# Patient Record
Sex: Female | Born: 1940 | Race: White | Hispanic: No | Smoking: Never smoker
Health system: Southern US, Community
[De-identification: ages and names within clinical notes are randomized; demographics above are authoritative.]

## PROBLEM LIST (undated history)

## (undated) DIAGNOSIS — I10 Essential (primary) hypertension: Secondary | ICD-10-CM

## (undated) DIAGNOSIS — E785 Hyperlipidemia, unspecified: Secondary | ICD-10-CM

## (undated) HISTORY — PX: APPENDECTOMY: SHX54

## (undated) HISTORY — PX: KNEE SURGERY: SHX244

## (undated) HISTORY — PX: CHOLECYSTECTOMY: SHX55

## (undated) HISTORY — PX: CARDIAC CATHETERIZATION: SHX172

## (undated) HISTORY — PX: OTHER SURGICAL HISTORY: SHX169

## (undated) HISTORY — PX: HEMORRHOID SURGERY: SHX153

## (undated) HISTORY — PX: ABDOMINAL HYSTERECTOMY: SHX81

---

## 2000-07-06 ENCOUNTER — Encounter: Payer: Self-pay | Admitting: Family Medicine

## 2000-07-06 ENCOUNTER — Ambulatory Visit (HOSPITAL_COMMUNITY): Admission: RE | Admit: 2000-07-06 | Discharge: 2000-07-06 | Payer: Self-pay | Admitting: Family Medicine

## 2000-09-09 ENCOUNTER — Emergency Department (HOSPITAL_COMMUNITY): Admission: EM | Admit: 2000-09-09 | Discharge: 2000-09-09 | Payer: Self-pay | Admitting: *Deleted

## 2000-09-10 ENCOUNTER — Encounter: Payer: Self-pay | Admitting: Internal Medicine

## 2000-09-10 ENCOUNTER — Ambulatory Visit (HOSPITAL_COMMUNITY): Admission: RE | Admit: 2000-09-10 | Discharge: 2000-09-10 | Payer: Self-pay | Admitting: Internal Medicine

## 2000-09-25 ENCOUNTER — Ambulatory Visit (HOSPITAL_COMMUNITY): Admission: RE | Admit: 2000-09-25 | Discharge: 2000-09-25 | Payer: Self-pay | Admitting: Orthopedic Surgery

## 2001-02-03 ENCOUNTER — Encounter: Payer: Self-pay | Admitting: Family Medicine

## 2001-02-03 ENCOUNTER — Ambulatory Visit (HOSPITAL_COMMUNITY): Admission: RE | Admit: 2001-02-03 | Discharge: 2001-02-03 | Payer: Self-pay | Admitting: Family Medicine

## 2001-03-02 ENCOUNTER — Ambulatory Visit (HOSPITAL_COMMUNITY): Admission: RE | Admit: 2001-03-02 | Discharge: 2001-03-02 | Payer: Self-pay | Admitting: Family Medicine

## 2001-03-03 ENCOUNTER — Encounter: Payer: Self-pay | Admitting: Family Medicine

## 2001-12-15 ENCOUNTER — Ambulatory Visit (HOSPITAL_COMMUNITY): Admission: RE | Admit: 2001-12-15 | Discharge: 2001-12-16 | Payer: Self-pay | Admitting: Cardiology

## 2002-02-10 ENCOUNTER — Ambulatory Visit (HOSPITAL_COMMUNITY): Admission: RE | Admit: 2002-02-10 | Discharge: 2002-02-10 | Payer: Self-pay | Admitting: Family Medicine

## 2002-02-10 ENCOUNTER — Encounter: Payer: Self-pay | Admitting: Family Medicine

## 2003-02-22 ENCOUNTER — Ambulatory Visit (HOSPITAL_COMMUNITY): Admission: RE | Admit: 2003-02-22 | Discharge: 2003-02-22 | Payer: Self-pay | Admitting: Family Medicine

## 2003-03-02 ENCOUNTER — Ambulatory Visit (HOSPITAL_COMMUNITY): Admission: RE | Admit: 2003-03-02 | Discharge: 2003-03-02 | Payer: Self-pay | Admitting: Family Medicine

## 2003-03-07 ENCOUNTER — Ambulatory Visit (HOSPITAL_COMMUNITY): Admission: RE | Admit: 2003-03-07 | Discharge: 2003-03-07 | Payer: Self-pay | Admitting: Family Medicine

## 2003-03-13 ENCOUNTER — Encounter: Payer: Self-pay | Admitting: Orthopedic Surgery

## 2004-02-13 ENCOUNTER — Ambulatory Visit (HOSPITAL_COMMUNITY): Admission: RE | Admit: 2004-02-13 | Discharge: 2004-02-13 | Payer: Self-pay | Admitting: Internal Medicine

## 2004-08-22 ENCOUNTER — Other Ambulatory Visit: Admission: RE | Admit: 2004-08-22 | Discharge: 2004-08-22 | Payer: Self-pay | Admitting: Obstetrics and Gynecology

## 2005-02-14 ENCOUNTER — Ambulatory Visit (HOSPITAL_COMMUNITY): Admission: RE | Admit: 2005-02-14 | Discharge: 2005-02-14 | Payer: Self-pay | Admitting: Internal Medicine

## 2005-04-03 ENCOUNTER — Other Ambulatory Visit: Admission: RE | Admit: 2005-04-03 | Discharge: 2005-04-03 | Payer: Self-pay | Admitting: Dermatology

## 2005-05-28 ENCOUNTER — Ambulatory Visit (HOSPITAL_COMMUNITY): Admission: RE | Admit: 2005-05-28 | Discharge: 2005-05-28 | Payer: Self-pay | Admitting: Family Medicine

## 2005-10-13 ENCOUNTER — Inpatient Hospital Stay (HOSPITAL_COMMUNITY): Admission: RE | Admit: 2005-10-13 | Discharge: 2005-10-22 | Payer: Self-pay | Admitting: Internal Medicine

## 2005-10-17 ENCOUNTER — Ambulatory Visit (HOSPITAL_COMMUNITY): Admission: RE | Admit: 2005-10-17 | Discharge: 2005-10-17 | Payer: Self-pay | Admitting: Internal Medicine

## 2005-10-18 ENCOUNTER — Ambulatory Visit: Payer: Self-pay | Admitting: Internal Medicine

## 2005-10-31 ENCOUNTER — Ambulatory Visit (HOSPITAL_COMMUNITY): Admission: RE | Admit: 2005-10-31 | Discharge: 2005-10-31 | Payer: Self-pay | Admitting: Internal Medicine

## 2005-11-05 ENCOUNTER — Ambulatory Visit: Payer: Self-pay | Admitting: Internal Medicine

## 2006-02-23 ENCOUNTER — Ambulatory Visit (HOSPITAL_COMMUNITY): Admission: RE | Admit: 2006-02-23 | Discharge: 2006-02-23 | Payer: Self-pay | Admitting: Internal Medicine

## 2006-08-20 ENCOUNTER — Ambulatory Visit (HOSPITAL_COMMUNITY): Admission: RE | Admit: 2006-08-20 | Discharge: 2006-08-20 | Payer: Self-pay | Admitting: Pulmonary Disease

## 2007-02-25 ENCOUNTER — Ambulatory Visit (HOSPITAL_COMMUNITY): Admission: RE | Admit: 2007-02-25 | Discharge: 2007-02-25 | Payer: Self-pay | Admitting: Internal Medicine

## 2008-02-20 ENCOUNTER — Emergency Department (HOSPITAL_COMMUNITY): Admission: EM | Admit: 2008-02-20 | Discharge: 2008-02-20 | Payer: Self-pay | Admitting: Emergency Medicine

## 2008-02-29 ENCOUNTER — Ambulatory Visit (HOSPITAL_COMMUNITY): Admission: RE | Admit: 2008-02-29 | Discharge: 2008-02-29 | Payer: Self-pay | Admitting: Internal Medicine

## 2008-10-12 ENCOUNTER — Emergency Department (HOSPITAL_COMMUNITY): Admission: EM | Admit: 2008-10-12 | Discharge: 2008-10-12 | Payer: Self-pay | Admitting: Emergency Medicine

## 2008-12-08 ENCOUNTER — Ambulatory Visit (HOSPITAL_COMMUNITY): Admission: RE | Admit: 2008-12-08 | Discharge: 2008-12-08 | Payer: Self-pay | Admitting: Family Medicine

## 2008-12-08 ENCOUNTER — Encounter: Payer: Self-pay | Admitting: Orthopedic Surgery

## 2008-12-14 ENCOUNTER — Ambulatory Visit: Payer: Self-pay | Admitting: Orthopedic Surgery

## 2008-12-14 DIAGNOSIS — M25569 Pain in unspecified knee: Secondary | ICD-10-CM | POA: Insufficient documentation

## 2008-12-14 DIAGNOSIS — M1710 Unilateral primary osteoarthritis, unspecified knee: Secondary | ICD-10-CM

## 2008-12-14 DIAGNOSIS — M23302 Other meniscus derangements, unspecified lateral meniscus, unspecified knee: Secondary | ICD-10-CM | POA: Insufficient documentation

## 2008-12-15 ENCOUNTER — Telehealth: Payer: Self-pay | Admitting: Orthopedic Surgery

## 2008-12-15 ENCOUNTER — Ambulatory Visit (HOSPITAL_COMMUNITY): Admission: RE | Admit: 2008-12-15 | Discharge: 2008-12-15 | Payer: Self-pay | Admitting: Orthopedic Surgery

## 2008-12-21 ENCOUNTER — Ambulatory Visit: Payer: Self-pay | Admitting: Orthopedic Surgery

## 2009-01-08 ENCOUNTER — Ambulatory Visit: Payer: Self-pay | Admitting: Orthopedic Surgery

## 2009-03-05 ENCOUNTER — Ambulatory Visit (HOSPITAL_COMMUNITY): Admission: RE | Admit: 2009-03-05 | Discharge: 2009-03-05 | Payer: Self-pay | Admitting: Family Medicine

## 2009-06-14 ENCOUNTER — Ambulatory Visit (HOSPITAL_COMMUNITY): Admission: RE | Admit: 2009-06-14 | Discharge: 2009-06-14 | Payer: Self-pay | Admitting: Family Medicine

## 2010-03-07 ENCOUNTER — Ambulatory Visit (HOSPITAL_COMMUNITY)
Admission: RE | Admit: 2010-03-07 | Discharge: 2010-03-07 | Payer: Self-pay | Source: Home / Self Care | Attending: Internal Medicine | Admitting: Internal Medicine

## 2010-04-14 ENCOUNTER — Encounter: Payer: Self-pay | Admitting: Internal Medicine

## 2010-06-04 ENCOUNTER — Other Ambulatory Visit (HOSPITAL_COMMUNITY): Payer: Self-pay | Admitting: Family Medicine

## 2010-06-04 ENCOUNTER — Ambulatory Visit (HOSPITAL_COMMUNITY)
Admission: RE | Admit: 2010-06-04 | Discharge: 2010-06-04 | Disposition: A | Discharge status: Home / Self Care | Payer: Medicare Other | Source: Ambulatory Visit | Attending: Family Medicine | Admitting: Family Medicine

## 2010-06-04 DIAGNOSIS — R42 Dizziness and giddiness: Secondary | ICD-10-CM | POA: Insufficient documentation

## 2010-06-04 DIAGNOSIS — R51 Headache: Secondary | ICD-10-CM | POA: Insufficient documentation

## 2010-06-04 DIAGNOSIS — R519 Headache, unspecified: Secondary | ICD-10-CM

## 2010-06-30 LAB — D-DIMER, QUANTITATIVE: D-Dimer, Quant: 0.46 ug/mL-FEU (ref 0.00–0.48)

## 2010-06-30 LAB — POCT CARDIAC MARKERS
CKMB, poc: <1 ng/mL — ABNORMAL LOW (ref 1.0–8.0)
Myoglobin, poc: 55.6 ng/mL (ref 12–200)

## 2010-06-30 LAB — DIFFERENTIAL
Eosinophils Absolute: 0.1 10*3/uL (ref 0.0–0.7)
Eosinophils Relative: 2 % (ref 0–5)
Lymphs Abs: 1.8 10*3/uL (ref 0.7–4.0)
Monocytes Absolute: 0.3 10*3/uL (ref 0.1–1.0)
Monocytes Relative: 5 % (ref 3–12)

## 2010-06-30 LAB — BASIC METABOLIC PANEL
Chloride: 108 mEq/L (ref 96–112)
GFR calc Af Amer: >60 mL/min (ref >60)
Potassium: 3.3 mEq/L — ABNORMAL LOW (ref 3.5–5.1)

## 2010-06-30 LAB — CBC
HCT: 39.9 % (ref 36.0–46.0)
MCV: 85.5 fL (ref 78.0–100.0)
RBC: 4.66 MIL/uL (ref 3.87–5.11)
WBC: 6.5 10*3/uL (ref 4.0–10.5)

## 2010-06-30 LAB — LIPASE, BLOOD: Lipase: 32 U/L (ref 11–59)

## 2010-08-09 NOTE — Discharge Summary (Signed)
NAME:  Tracy Townsend, FEHRMAN NO.:  [id number]   MEDICAL RECORD NO.:  [id number]          PATIENT TYPE:  INP   LOCATION:  A302                          FACILITY:  [facility]   PHYSICIAN:  Madelin Rear. Sherwood Gambler, MD  DATE OF BIRTH:  [birth year]   DATE OF ADMISSION:  10/13/2005  DATE OF DISCHARGE:  08/01/2007LH                                 DISCHARGE SUMMARY   DISCHARGE MEDICATIONS:  1. Fosamax D every week.  2. Premarin 0.325 p.o. daily.  3. Cozaar 100 mg p.o. daily.  4. Xanax p.r.n. 0.5 q.i.d. and q.h.s.  5. Detrol LA one q.i.d. p.r.n.  6. Spiriva one inhalation daily.  7. Cipro 500 mg p.o. b.i.d.  8. Aciphex 20 mg p.o. daily.   DISCHARGE DIAGNOSES:  1. Hypertension.  2. Allergic rhinitis.  3. Asthma, acute exacerbation.  4. Coronary artery disease.  5. Anxiety.  6. Uterine fibroids.  7. Vasomotor postmenopausal symptomatology.  8. Osteoarthritis.  9. Pyelonephritis.   SUMMARY:  Patient was admitted with satiety, nausea, weight loss, fever,  chills, back pain, and clinically noted to have CVA tenderness on  examination.  Laboratories confirmed including a CT a severe pyelonephritis  without abscess, however, marked nephric swelling and edema per radiology  report.  She was admitted with high-dose antibiotic therapy and analgesia.  Her blood culture confirmed E. coli sepsis.  She was continued on quinolone  antibiotics with improvement on a daily basis and discharged hemodynamically  clinically stable.  She was also seen in consultation by GI for her anorexia  and weight loss.      Madelin Rear. Sherwood Gambler, MD  Electronically Signed     LJF/MEDQ  D:  10/30/2005  T:  10/30/2005  Job:  [id number]

## 2010-08-09 NOTE — Cardiovascular Report (Signed)
NAME:  Tracy Townsend, Tracy Townsend                        ACCOUNT NO.:  [id number]   MEDICAL RECORD NO.:  [id number]                   PATIENT TYPE:  OIB   LOCATION:  2861                                 FACILITY:  [facility]   PHYSICIAN:  Cristy Hilts. Jacinto Halim, M.D.                  DATE OF BIRTH:  May 19, 1940   DATE OF PROCEDURE:  12/15/2001  DATE OF DISCHARGE:                              CARDIAC CATHETERIZATION   PROCEDURES PERFORMED:  1. Left heart catheterization including left ventriculography.  2. Selective right and left coronary arteriography.  3. Right femoral angiography and closure of the right femoral artery access     with Perclose.   INDICATION:  The patient is a 70 year old white female with a history of  hypertension, who underwent Cardiolite stress test which revealed  anteroapical ischemia.  Given her anxiety, chest pain and abnormal  Cardiolite stress test, a cardiac catheterization is being performed to  evaluate her coronary anatomy.   HEMODYNAMIC. DATA:  The left ventricular pressures were 159/5 with an end-  diastolic pressure of 18 mmHg. The aortic pressure 144/24 with a mean of 103  mmHg.  There was no pressure gradient across the aortic valve.   ANGIOGRAPHIC DATA:   LEFT VENTRICULOGRAM:  The left ventricular systolic function was normal and  ejection fraction was estimated at 60%.  There was no significant mitral  regurgitation.   Right coronary artery:  The right coronary artery is a very large caliber  vessel and a dominant vessel. It gives origin to a very large PDA and PLV  branches.  It has minimal luminal irregularity.   Circumflex artery:  The circumflex artery is a moderate to moderate large  caliber vessel. It gives origin to a moderate sized OM-1. It has mild  disease.   Left anterior descending artery:  The left anterior descending artery is a  large caliber vessel. It ends at the apex. It gives origin to three moderate  sized anterolateral branches, that is  diagonal #1, diagonal #2 and diagonal  #3 branches.  It has mild luminal irregularity.   IMPRESSION:  1. Normal left ventricular ejection fraction 60%.  2. Mild insignificant coronary artery disease.   RECOMMENDATIONS:  Based on her coronary anatomy, continued aggressive risk  factor modification is indicated.  Evaluation for noncardiac cause of chest  pain is indicated.   TECHNIQUE OF PROCEDURE:  Under the usual sterile precautions, using a 6  French right femoral artery access a 6 [place]  multipurpose B2 catheter was  advanced into the ascending aorta over a 0.035 inch J wire. The catheter was  gently advanced in the left ventricle and left ventricular pressures were  monitored. Hand contrast injection of the left ventricle was performed both  in LAO and RAO projection. Then the catheter was flushed with saline, pulled  back into the ascending aorta, and pressure gradient across gradient across  the aortic valve  was monitored. The right coronary artery was selectively  engaged and angiography was performed.  In a similar fashion, the left main  coronary artery was selectively engaged and angiography was performed. Then  the catheter was pulled out of the body.   Right femoral angiography was performed through the arterial access sheath  and then the access was closed with Perclose. Excellent hemostasis obtained.  The patient was transferred to recovery in a stable condition. The patient  tolerated the procedure well.                                                 Cristy Hilts. Jacinto Halim, M.D.    Pilar Plate  D:  12/15/2001  T:  12/18/2001  Job:  [id number]   cc:   Madelin Rear. Sherwood Gambler, M.D.  [address]  [place]  [place] [address]  Fax: [phone]   Sherral Hammers, M.D.
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
## 2010-08-09 NOTE — H&P (Signed)
NAME:  Tracy Townsend, Tracy Townsend NO.:  [id number]   MEDICAL RECORD NO.:  [id number]          PATIENT TYPE:  INP   LOCATION:  A302                          FACILITY:  [facility]   PHYSICIAN:  Madelin Rear. Sherwood Gambler, MD  DATE OF BIRTH:  [birth year]   DATE OF ADMISSION:  DATE OF DISCHARGE:  LH                                HISTORY & PHYSICAL   CHIEF COMPLAINT:  Fatigue, anorexia, chills.   HISTORY OF PRESENT ILLNESS:  Patient presented to the office with recent  urinary tract infection in March and had recurrent symptoms of burning on  urination, urgency, frequency, some left-sided flank pain, and chills.  She  denied any true rigors.  No vomiting.  Her appetite has been diminished and,  in fact, she has lost 4 pounds since her last visit in one month.   PAST MEDICAL HISTORY:  1.  Hypertension.  2.  Allergic rhinitis.  3.  Asthma.  4.  Mild coronary artery disease.  5.  Anxiety.  6.  Uterine fibroids.  7.  Postmenopausal.  8.  Vasomotor symptomatology.  9.  Osteoarthritis.   SOCIAL HISTORY:  Nonsmoker, nondrinker.  No drug use.  Widowed with one  daughter who is healthy.  She is status post hysterectomy.   FAMILY HISTORY:  Positive for COPD and cancer in her mother and father as  well as coronary artery disease in her paternal and maternal grandparents.  She has one brother deceased, prostate cancer.  Another brother had a  cerebrovascular accident.   REVIEW OF SYSTEMS:  As above.  She also complained of an unrelenting  bitemporal headache and malaise.  Otherwise, in detail negative.   PHYSICAL EXAMINATION:  __________ unremarkable.  HEENT:  No JVD or adenopathy.  NECK:  Supple.  CHEST:  Clear.  CARDIAC:  Regular rate and rhythm.  ABDOMEN:  Soft.  She does have some left-sided CVA tenderness, but not  marked.  EXTREMITIES:  Without clubbing, cyanosis, edema.  NEUROLOGIC:  Nonfocal.   LABORATORIES:  A CT scan was obtained to rule out occult malignancy in  addition to  rule out cerebral hemorrhage for the cause of her headache.  Dr.  Jena Gauss of radiology called me and was quite struck with the severity and  degree of pyelonephritis findings as well as urethral inflammation.  There  was no abscess.  According to Dr. Jena Gauss there was no extravasation or  evidence of obstruction.   IMPRESSION:  1.  Severe pyelonephritis with severe constitutional symptoms.  Patient will      be admitted for parenteral antibiotics pending cultures of blood and      urine.  The remainder of her medical problems will be observed.  We      would expect an observation.  2.  Hypertension.  We will continue her angiotensin receptor blocker, i.e.,      Cozaar and hold if any blood pressure issues arrive.  I am mostly going      to cover her in addition to quinolone with gentamicin pending culture      results due to the severity of inflammation.  Madelin Rear. Sherwood Gambler, MD  Electronically Signed     LJF/MEDQ  D:  10/13/2005  T:  10/13/2005  Job:  [id number]

## 2010-08-09 NOTE — Procedures (Signed)
NAME:  Tracy Townsend, AUST NO.:  [id number]   MEDICAL RECORD NO.:  [id number]          PATIENT TYPE:  OUT   LOCATION:  RAD                           FACILITY:  [facility]   PHYSICIAN:  Edward L. Juanetta Gosling, M.D.DATE OF BIRTH:  1940/06/20   DATE OF PROCEDURE:  DATE OF DISCHARGE:  08/20/2006                            PULMONARY FUNCTION TEST   PULMONARY FUNCTION TEST   RESULTS:  1. Spirometry shows no ventilatory defect and evidence of mild airflow      obstruction.  2. Lung volumes are normal.  3. DLCO is normal.  4. Arterial blood gases were normal.      Edward L. Juanetta Gosling, M.D.  Electronically Signed     ELH/MEDQ  D:  08/24/2006  T:  08/25/2006  Job:  [id number]
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
## 2011-01-07 ENCOUNTER — Other Ambulatory Visit (HOSPITAL_COMMUNITY): Payer: Self-pay | Admitting: Internal Medicine

## 2011-01-07 DIAGNOSIS — Z139 Encounter for screening, unspecified: Secondary | ICD-10-CM

## 2011-03-11 ENCOUNTER — Ambulatory Visit (HOSPITAL_COMMUNITY)
Admission: RE | Admit: 2011-03-11 | Discharge: 2011-03-11 | Disposition: A | Discharge status: Home / Self Care | Payer: Medicare Other | Source: Ambulatory Visit | Attending: Internal Medicine | Admitting: Internal Medicine

## 2011-03-11 DIAGNOSIS — Z139 Encounter for screening, unspecified: Secondary | ICD-10-CM

## 2011-03-11 DIAGNOSIS — Z1231 Encounter for screening mammogram for malignant neoplasm of breast: Secondary | ICD-10-CM | POA: Insufficient documentation

## 2011-06-27 ENCOUNTER — Emergency Department (HOSPITAL_COMMUNITY)
Admission: EM | Admit: 2011-06-27 | Discharge: 2011-06-27 | Disposition: A | Discharge status: Home / Self Care | Payer: Medicare Other | Attending: Emergency Medicine | Admitting: Emergency Medicine

## 2011-06-27 ENCOUNTER — Emergency Department (HOSPITAL_COMMUNITY): Payer: Medicare Other

## 2011-06-27 ENCOUNTER — Other Ambulatory Visit: Payer: Self-pay

## 2011-06-27 ENCOUNTER — Encounter (HOSPITAL_COMMUNITY): Payer: Self-pay

## 2011-06-27 DIAGNOSIS — H539 Unspecified visual disturbance: Secondary | ICD-10-CM | POA: Insufficient documentation

## 2011-06-27 DIAGNOSIS — M549 Dorsalgia, unspecified: Secondary | ICD-10-CM | POA: Insufficient documentation

## 2011-06-27 DIAGNOSIS — M2550 Pain in unspecified joint: Secondary | ICD-10-CM | POA: Insufficient documentation

## 2011-06-27 DIAGNOSIS — R0989 Other specified symptoms and signs involving the circulatory and respiratory systems: Secondary | ICD-10-CM | POA: Insufficient documentation

## 2011-06-27 DIAGNOSIS — R079 Chest pain, unspecified: Secondary | ICD-10-CM | POA: Insufficient documentation

## 2011-06-27 DIAGNOSIS — M542 Cervicalgia: Secondary | ICD-10-CM | POA: Insufficient documentation

## 2011-06-27 DIAGNOSIS — M79609 Pain in unspecified limb: Secondary | ICD-10-CM | POA: Insufficient documentation

## 2011-06-27 DIAGNOSIS — I10 Essential (primary) hypertension: Secondary | ICD-10-CM | POA: Insufficient documentation

## 2011-06-27 DIAGNOSIS — M25519 Pain in unspecified shoulder: Secondary | ICD-10-CM | POA: Insufficient documentation

## 2011-06-27 DIAGNOSIS — R0609 Other forms of dyspnea: Secondary | ICD-10-CM | POA: Insufficient documentation

## 2011-06-27 LAB — HEPATIC FUNCTION PANEL
Albumin: 3.7 g/dL (ref 3.5–5.2)
Bilirubin, Direct: 0.1 mg/dL (ref 0.0–0.3)
Total Bilirubin: 0.5 mg/dL (ref 0.3–1.2)

## 2011-06-27 LAB — CBC
MCH: 28.4 pg (ref 26.0–34.0)
MCHC: 32.6 g/dL (ref 30.0–36.0)
Platelets: 288 10*3/uL (ref 150–400)
RDW: 13.3 % (ref 11.5–15.5)

## 2011-06-27 LAB — BASIC METABOLIC PANEL
BUN: 21 mg/dL (ref 6–23)
GFR calc Af Amer: 73 mL/min — ABNORMAL LOW (ref >90)
GFR calc non Af Amer: 63 mL/min — ABNORMAL LOW (ref >90)
Potassium: 4 mEq/L (ref 3.5–5.1)

## 2011-06-27 LAB — DIFFERENTIAL
Basophils Relative: 0 % (ref 0–1)
Eosinophils Absolute: 0.1 10*3/uL (ref 0.0–0.7)
Monocytes Relative: 8 % (ref 3–12)
Neutrophils Relative %: 59 % (ref 43–77)

## 2011-06-27 MED ORDER — IOHEXOL 350 MG/ML SOLN
100.0000 mL | Freq: Once | INTRAVENOUS | Status: AC | PRN
  Administered 2011-06-27: 100 mL via INTRAVENOUS

## 2011-06-27 NOTE — Discharge Instructions (Signed)
Continue taking twice your bp medicine until you see dr. Sherwood Gambler next week.

## 2011-06-27 NOTE — ED Provider Notes (Cosign Needed)
This chart was scribed for Tracy Lennert, MD by Williemae Natter. The patient was seen in room APA07/APA07 at 11:56 AM.  History     CSN: [id number]  Arrival date & time 06/27/11  1119   First MD Initiated Contact with Patient 06/27/11 1150      Chief Complaint  Patient presents with  . Chest Pain  . Shoulder Pain  . Arm Pain    (Consider location/radiation/quality/duration/timing/severity/associated sxs/prior treatment) Patient is a 71 y.o. female presenting with chest pain. The history is provided by the patient (pt has had chest pain going into back.  sent over by her md). No language interpreter was used.  Chest Pain The chest pain began 2 days ago. Chest pain occurs frequently. The chest pain is improving. The pain is associated with lifting. At its most intense, the pain is at 3/10. The pain is currently at 2/10. The severity of the pain is moderate. The quality of the pain is described as aching. The pain does not radiate. Chest pain is worsened by certain positions. Pertinent negatives for primary symptoms include no fatigue, no cough and no abdominal pain.  Pertinent negatives for past medical history include no seizures.    VESTER TITSWORTH is a 71 y.o. female who presents to the Emergency Department complaining of intermittent moderate acute onset chest pain. Pt started seeing spots 3 days ago. Pt has discomfort in neck, back, chest, shoulder, and bottom of arms for 3 days. Pt has associated nausea. No excessive sweating. Pt had a cardiac cath done 7 years ago. Pt has pain in both shoulders since February.  Cardiologist-Dr. Allyson Sabal History reviewed. No pertinent past medical history.  Past Surgical History  Procedure Date  . Abdominal hysterectomy   . Cholecystectomy   . Bladder surg   . Knee surgery   . Cardiac catheterization     No family history on file.  History  Substance Use Topics  . Smoking status: Never Smoker   . Smokeless tobacco: Not on file  . Alcohol  Use: No    OB History    Grav Para Term Preterm Abortions TAB SAB Ect Mult Living                  Review of Systems  Constitutional: Negative for fatigue.  HENT: Positive for neck pain. Negative for congestion, sinus pressure and ear discharge.   Eyes: Positive for visual disturbance. Negative for discharge.  Respiratory: Negative for cough.   Cardiovascular: Positive for chest pain.  Gastrointestinal: Negative for abdominal pain and diarrhea.  Genitourinary: Negative for frequency and hematuria.  Musculoskeletal: Positive for arthralgias. Negative for back pain.  Skin: Negative for rash.  Neurological: Negative for seizures and headaches.  Hematological: Negative.   Psychiatric/Behavioral: Negative for hallucinations.  All other systems reviewed and are negative.    Allergies  Morphine and related  Home Medications  No current outpatient prescriptions on file.  BP 151/88  Pulse 74  Temp(Src) 98.2 F (36.8 C) (Oral)  Resp 18  Ht 5\' 2"  (1.575 m)  Wt 135 lb (61.236 kg)  BMI 24.69 kg/m2  SpO2 100%  Physical Exam  Nursing note and vitals reviewed. Constitutional: She is oriented to person, place, and time. She appears well-developed and well-nourished. No distress.  HENT:  Head: Normocephalic and atraumatic.  Eyes: Conjunctivae and EOM are normal. No scleral icterus.  Neck: Normal range of motion. Neck supple. No thyromegaly present.  Cardiovascular: Normal rate and regular rhythm.  Exam reveals  no gallop and no friction rub.   No murmur heard. Pulmonary/Chest: No stridor. She is in respiratory distress. She has no wheezes. She has no rales. She exhibits no tenderness.       Tenderness in mid thoracic area  Abdominal: Soft. She exhibits no distension. There is no tenderness. There is no rebound.  Lymphadenopathy:    She has no cervical adenopathy.  Neurological: She is alert and oriented to person, place, and time. Coordination normal.  Skin: Skin is warm and  dry. No rash noted. No erythema.  Psychiatric: She has a normal mood and affect. Her behavior is normal.    ED Course  Procedures (including critical care time) DIAGNOSTIC STUDIES: Oxygen Saturation is 100% on room air, normal by my interpretation.    COORDINATION OF CARE: Medications - No data to display     Labs Reviewed  CBC  DIFFERENTIAL  BASIC METABOLIC PANEL  TROPONIN I   No results found.   No diagnosis found.   Date: 06/27/2011  Rate: 71  Rhythm: normal sinus rhythm  QRS Axis: left  Intervals: normal  ST/T Wave abnormalities: normal  Conduction Disutrbances:none  Poor r wave progression  Narrative Interpretation:   Old EKG Reviewed: none available    MDM  Chest and back pain worse with movement.  Ct neg  The chart was scribed for me under my direct supervision.  I personally performed the history, physical, and medical decision making and all procedures in the evaluation of this patient.Tracy Lennert, MD 06/28/11 [phone]

## 2011-06-27 NOTE — ED Notes (Signed)
Pt c/o pain in center of chest and aching in back across both shoulders x 3 days.  Says has been intermittent  And has had some nausea and SOB.  Reports bp has been for 3 days as well and started doubling her bp medication.

## 2011-06-27 NOTE — ED Notes (Signed)
Pt sent by Dr Sherwood Gambler for chest pain, shoulder pain, and arm pain x 3 days.

## 2011-06-28 ENCOUNTER — Emergency Department (HOSPITAL_COMMUNITY): Payer: Medicare Other

## 2011-06-28 ENCOUNTER — Other Ambulatory Visit: Payer: Self-pay

## 2011-06-28 ENCOUNTER — Encounter (HOSPITAL_COMMUNITY): Payer: Self-pay | Admitting: Emergency Medicine

## 2011-06-28 ENCOUNTER — Emergency Department (HOSPITAL_COMMUNITY)
Admission: EM | Admit: 2011-06-28 | Discharge: 2011-06-28 | Disposition: A | Discharge status: Home / Self Care | Payer: Medicare Other | Attending: Emergency Medicine | Admitting: Emergency Medicine

## 2011-06-28 DIAGNOSIS — R252 Cramp and spasm: Secondary | ICD-10-CM | POA: Insufficient documentation

## 2011-06-28 DIAGNOSIS — Z7982 Long term (current) use of aspirin: Secondary | ICD-10-CM | POA: Insufficient documentation

## 2011-06-28 DIAGNOSIS — R079 Chest pain, unspecified: Secondary | ICD-10-CM | POA: Insufficient documentation

## 2011-06-28 DIAGNOSIS — R42 Dizziness and giddiness: Secondary | ICD-10-CM | POA: Insufficient documentation

## 2011-06-28 DIAGNOSIS — R5381 Other malaise: Secondary | ICD-10-CM | POA: Insufficient documentation

## 2011-06-28 DIAGNOSIS — R202 Paresthesia of skin: Secondary | ICD-10-CM

## 2011-06-28 DIAGNOSIS — R209 Unspecified disturbances of skin sensation: Secondary | ICD-10-CM | POA: Insufficient documentation

## 2011-06-28 DIAGNOSIS — F29 Unspecified psychosis not due to a substance or known physiological condition: Secondary | ICD-10-CM | POA: Insufficient documentation

## 2011-06-28 DIAGNOSIS — I10 Essential (primary) hypertension: Secondary | ICD-10-CM | POA: Insufficient documentation

## 2011-06-28 DIAGNOSIS — E785 Hyperlipidemia, unspecified: Secondary | ICD-10-CM | POA: Insufficient documentation

## 2011-06-28 DIAGNOSIS — Z79899 Other long term (current) drug therapy: Secondary | ICD-10-CM | POA: Insufficient documentation

## 2011-06-28 HISTORY — DX: Essential (primary) hypertension: I10

## 2011-06-28 HISTORY — DX: Hyperlipidemia, unspecified: E78.5

## 2011-06-28 LAB — CBC
HCT: 43.9 % (ref 36.0–46.0)
Hemoglobin: 14.1 g/dL (ref 12.0–15.0)
MCH: 27.9 pg (ref 26.0–34.0)
MCHC: 32.1 g/dL (ref 30.0–36.0)
MCV: 86.9 fL (ref 78.0–100.0)
Platelets: 289 K/uL (ref 150–400)
RBC: 5.05 MIL/uL (ref 3.87–5.11)
RDW: 13.3 % (ref 11.5–15.5)
WBC: 8.5 K/uL (ref 4.0–10.5)

## 2011-06-28 LAB — BASIC METABOLIC PANEL WITH GFR
BUN: 26 mg/dL — ABNORMAL HIGH (ref 6–23)
CO2: 26 meq/L (ref 19–32)
Calcium: 9.3 mg/dL (ref 8.4–10.5)
Chloride: 103 meq/L (ref 96–112)
Creatinine, Ser: 0.97 mg/dL (ref 0.50–1.10)
GFR calc Af Amer: 66 mL/min — ABNORMAL LOW
GFR calc non Af Amer: 57 mL/min — ABNORMAL LOW
Glucose, Bld: 112 mg/dL — ABNORMAL HIGH (ref 70–99)
Potassium: 4.1 meq/L (ref 3.5–5.1)
Sodium: 137 meq/L (ref 135–145)

## 2011-06-28 LAB — URINALYSIS, ROUTINE W REFLEX MICROSCOPIC
Bilirubin Urine: NEGATIVE
Hgb urine dipstick: NEGATIVE
Protein, ur: NEGATIVE mg/dL
Urobilinogen, UA: 0.2 mg/dL (ref 0.0–1.0)

## 2011-06-28 LAB — TROPONIN I: Troponin I: <0.3 ng/mL

## 2011-06-28 LAB — DIFFERENTIAL
Lymphocytes Relative: 29 % (ref 12–46)
Lymphs Abs: 2.5 10*3/uL (ref 0.7–4.0)
Neutro Abs: 5.3 10*3/uL (ref 1.7–7.7)
Neutrophils Relative %: 63 % (ref 43–77)

## 2011-06-28 LAB — CK: Total CK: 66 U/L (ref 7–177)

## 2011-06-28 MED ORDER — DIAZEPAM 2 MG PO TABS: 2.0000 mg | ORAL_TABLET | Freq: Once | ORAL | Status: DC

## 2011-06-28 NOTE — ED Provider Notes (Signed)
History     CSN: [id number]  Arrival date & time 06/28/11  1644   First MD Initiated Contact with Patient 06/28/11 1657      Chief Complaint  Patient presents with  . Chest Pain  . Hypertension  . Weakness     HPI Pt was seen at 1715.   Per pt, c/o gradual onset and persistence of constant generalized weakness for the past 3 to 4 days.  Has been associated with multiple other symptoms.  Pt states when moving from sit to standing positions for the past 4 days she c/o feeling lightheaded, "tingling and numb all over" her body (no specific area of tingling/numbness), "cold feeling" all over her anterior torso (denies specific CP or abd pain), seeing "spots before my eyes," and "my thoughts get all jumbled up and I feel confused" which all improve after she sits back down or lays down.  States these symptoms only occur when she is home alone by herself.  She went out this morning/afternoon with her daughter shopping and to lunch and did not experience any symptoms other than "just feeling weak."  Pt states she has been taking her BP at home "every 2 hours" for the last 4 days, and these symptoms generally occur when she stands up to go take her BP.  Pt states her BP "shoots up" throughout the day to "160-180's/100-110's."  States she has been taking "double" of her BP meds "for a while now" and forgot to tell the EDP yesterday when she was seen for these symptoms or her PMD yesterday and today when she called regarding these multiple symptoms.  Pt also c/o "muscles cramping" in her bilat LE's "for a while now;" denies any change in this muscle cramping over the past several weeks to months.  Denies focal motor weakness, no tingling/numbness in extremities, no fevers, no rash, no back pain, no abd pain, no CP/SOB, no cough.       Past Medical History  Diagnosis Date  . Hypertension   . Hyperlipemia     Past Surgical History  Procedure Date  . Abdominal hysterectomy   . Cholecystectomy   .  Bladder surg   . Knee surgery   . Cardiac catheterization     History  Substance Use Topics  . Smoking status: Never Smoker   . Smokeless tobacco: Not on file  . Alcohol Use: No    Review of Systems ROS: Statement: All systems negative except as marked or noted in the HPI; Constitutional: Negative for fever and chills. ; ; Eyes: Negative for eye pain, redness and discharge. ; ; ENMT: Negative for ear pain, hoarseness, nasal congestion, sinus pressure and sore throat. ; ; Cardiovascular: Negative for chest pain, palpitations, diaphoresis, dyspnea and peripheral edema. ; ; Respiratory: Negative for cough, wheezing and stridor. ; ; Gastrointestinal: Negative for nausea, vomiting, diarrhea, abdominal pain, blood in stool, hematemesis, jaundice and rectal bleeding. . ; ; Genitourinary: Negative for dysuria, flank pain and hematuria. ; ; Musculoskeletal: Negative for back pain and neck pain. Negative for swelling and trauma.; ; Skin: Negative for pruritus, rash, abrasions, blisters, bruising and skin lesion.; ; Neuro: +generalized weakness, paresthesias, confusion. Negative for headache, lightheadedness and neck stiffness. Negative for altered level of consciousness , extremity weakness, involuntary movement, seizure and syncope.      Allergies  Morphine and related and Latex  Home Medications   Current Outpatient Rx  Name Route Sig Dispense Refill  . ASPIRIN EC 81 MG PO TBEC  Oral Take 162 mg by mouth daily.    Marland Kitchen LOSARTAN POTASSIUM 50 MG PO TABS Oral Take 50 mg by mouth daily.    Marland Kitchen PRAVASTATIN SODIUM 20 MG PO TABS Oral Take 20 mg by mouth daily.      BP 192/101  Pulse 73  Temp(Src) 98.4 F (36.9 C) (Oral)  Resp 18  Ht 5\' 2"  (1.575 m)  Wt 132 lb (59.875 kg)  BMI 24.14 kg/m2  SpO2 100%  Physical Exam 1720: Physical examination:  Nursing notes reviewed; Vital signs and O2 SAT reviewed;  Constitutional: Well developed, Well nourished, Well hydrated, In no acute distress; Head:   Normocephalic, atraumatic; Eyes: EOMI, PERRL, No scleral icterus; ENMT: Mouth and pharynx normal, Mucous membranes moist; Neck: Supple, Full range of motion, No lymphadenopathy; Cardiovascular: Regular rate and rhythm, No murmur, rub, or gallop; Respiratory: Breath sounds clear & equal bilaterally, No rales, rhonchi, wheezes, or rub, Normal respiratory effort/excursion; Chest: Nontender, Movement normal; Abdomen: Soft, Nontender, Nondistended, Normal bowel sounds; Genitourinary: No CVA tenderness; Extremities: Pulses normal, No tenderness, No edema, No calf edema or asymmetry.; Neuro: AA&Ox3, Major CN grossly intact. Speech clear, no facial droop, normal coordination. No gross focal motor or sensory deficits in extremities.; Skin: Color normal, Warm, Dry, no rash; Psych:  Affect flat, poor eye contact, anxious and argumentative.    ED Course  Procedures  1725:  Pt is a rambling historian.  Issues pt keeps repeating to me today:  Symptoms occur when she is home alone only, they did not occur when she was out shopping/having lunch with her family today, occur before she takes her BP every 2 hours, and occur only during sit to stand position change only (after she has been sitting for a prolonged period of time).  Pt was eval yesterday in ED for chest and shoulders pain with negative work up and she denies having these symptoms today; pt apparently did not c/o today's symptoms during yesterday's ED visit.  Pt appears anxious during my exam, easily argumentative and agitated, laying with her eyes closed refusing to make eye contact with me while she tells me her HPI.  Pt will then intermittently grab her left leg and c/o "muscles are cramping where I broke my leg a long time ago, remember?" to her daughter.  Pedal pulses are palpable and equal bilat, skin is W/D/good color, muscle compartments are soft, no calf asymmetry or specific tenderness on exam.  No known hx of PAD or TAA/AAA (has had normal aorta per [place]  abd 2007 and CT scans 2007, 2008 and yesterday's ED visit). Will order a dose valium for muscle cramping here as work up progresses.  Very long d/w pt and family regarding taking her home BP's "every 2 hours," and ED treatment for HTN:  pt verb understanding, though still continues to argue with me about her "BP shooting up" and that she was "sent here for something to be done."  Explained work up I was ordering and that dropping BP precipitously can cause stroke and/or seizure; therefore BP needs to be slowly lowered over days to weeks.  Verb understanding.          MDM  MDM Reviewed: previous chart, nursing note and vitals Reviewed previous: ECG, CT scan and labs Interpretation: ECG, labs, x-ray and CT scan    Date: 06/28/2011  Rate: 74  Rhythm: normal sinus rhythm  QRS Axis: left  Intervals: normal  ST/T Wave abnormalities: normal  Conduction Disutrbances:none  Narrative Interpretation:  Old EKG Reviewed: unchanged; no significant changes from previous EKG dated 06/27/2011 and is improved from previous EKG dated 10/12/2008 which had NS STTW changes present.   Results for orders placed during the hospital encounter of 06/28/11  CBC      Component Value Range   WBC 8.5  4.0 - 10.5 (K/uL)   RBC 5.05  3.87 - 5.11 (MIL/uL)   Hemoglobin 14.1  12.0 - 15.0 (g/dL)   HCT 16.1  09.6 - 04.5 (%)   MCV 86.9  78.0 - 100.0 (fL)   MCH 27.9  26.0 - 34.0 (pg)   MCHC 32.1  30.0 - 36.0 (g/dL)   RDW 40.9  81.1 - 91.4 (%)   Platelets 289  150 - 400 (K/uL)  DIFFERENTIAL      Component Value Range   Neutrophils Relative 63  43 - 77 (%)   Neutro Abs 5.3  1.7 - 7.7 (K/uL)   Lymphocytes Relative 29  12 - 46 (%)   Lymphs Abs 2.5  0.7 - 4.0 (K/uL)   Monocytes Relative 7  3 - 12 (%)   Monocytes Absolute 0.6  0.1 - 1.0 (K/uL)   Eosinophils Relative 1  0 - 5 (%)   Eosinophils Absolute 0.1  0.0 - 0.7 (K/uL)   Basophils Relative 1  0 - 1 (%)   Basophils Absolute 0.0  0.0 - 0.1 (K/uL)  BASIC METABOLIC  PANEL      Component Value Range   Sodium 137  135 - 145 (mEq/L)   Potassium 4.1  3.5 - 5.1 (mEq/L)   Chloride 103  96 - 112 (mEq/L)   CO2 26  19 - 32 (mEq/L)   Glucose, Bld 112 (*) 70 - 99 (mg/dL)   BUN 26 (*) 6 - 23 (mg/dL)   Creatinine, Ser 7.82  0.50 - 1.10 (mg/dL)   Calcium 9.3  8.4 - 95.6 (mg/dL)   GFR calc non Af Amer 57 (*) >90 (mL/min)   GFR calc Af Amer 66 (*) >90 (mL/min)  TROPONIN I      Component Value Range   Troponin I <0.30  <0.30 (ng/mL)  CK      Component Value Range   Total CK 66  7 - 177 (U/L)  URINALYSIS, ROUTINE W REFLEX MICROSCOPIC      Component Value Range   Color, Urine YELLOW  YELLOW    APPearance CLEAR  CLEAR    Specific Gravity, Urine 1.010  1.005 - 1.030    pH 6.5  5.0 - 8.0    Glucose, UA NEGATIVE  NEGATIVE (mg/dL)   Hgb urine dipstick NEGATIVE  NEGATIVE    Bilirubin Urine NEGATIVE  NEGATIVE    Ketones, ur NEGATIVE  NEGATIVE (mg/dL)   Protein, ur NEGATIVE  NEGATIVE (mg/dL)   Urobilinogen, UA 0.2  0.0 - 1.0 (mg/dL)   Nitrite NEGATIVE  NEGATIVE    Leukocytes, UA NEGATIVE  NEGATIVE    Dg Chest 2 View 06/28/2011  *RADIOLOGY REPORT*  Clinical Data: COPD.  Weakness  CHEST - 2 VIEW  Comparison: 06/27/2011  Findings: COPD with hyperinflation.  Negative for pneumonia. Negative for heart failure.  Sub centimeter nodule lateral to the aortic arch is unchanged from prior studies and unchanged from the CT 06/14/2009.  IMPRESSION: COPD.  No acute cardiopulmonary disease.  Original Report Authenticated By: Camelia Phenes, M.D.   Ct Head Wo Contrast 06/28/2011  *RADIOLOGY REPORT*  Clinical Data: Weakness and hypertension  CT HEAD WITHOUT CONTRAST  Technique:  Contiguous  axial images were obtained from the base of the skull through the vertex without contrast.  Comparison: 06/04/2010  Findings: There is mild low density within the the subcortical and periventricular white matter consistent with chronic small vessel ischemic change.  The brain has an otherwise normal  appearance without evidence for hemorrhage, infarction, hydrocephalus, or mass lesion.  There is no extra axial fluid collection.  The skull and paranasal sinuses are normal.  IMPRESSION:  1.  No acute intracranial abnormalities. 2.  Mild white matter changes compatible with chronic small vessel ischemic change.  Original Report Authenticated By: Rosealee Albee, M.D.   Ct Angio Chest W/cm &/or Wo Cm 06/27/2011  *RADIOLOGY REPORT*  Clinical Data:  Shoulder and arm pain, evaluate for thoracic aortic dissection.  CT ANGIOGRAPHY CHEST WITH CONTRAST  Technique:  Multidetector CT imaging of the chest was performed using the standard protocol during bolus administration of intravenous contrast.  Multiplanar CT image reconstructions including MIPs were obtained to evaluate the vascular anatomy.  Contrast:  100 ml Omnipaque-300  Comparison:  Chest CT - 06/14/2009; 08/20/2006  Vascular Findings:  Normal caliber of the ascending thoracic aorta measuring approximately 3.3 x 3.3 cm in greatest transverse axial dimension as measured at the level of the main pulmonary artery (image 36, series five).  There is minimal atherosclerotic calcification at the origin of the right common carotid artery and undersurface of the aortic arch.  Normal configuration of the aortic arch.  The imaged portions of the cervical vasculature are patent.  There is scattered minimal calcified and noncalcified eccentric atherosclerotic plaque within the normal caliber descending thoracic aorta.  The descending thoracic aorta measures approximately 2.1 x 2.2 cm as measured at the level of the diaphragmatic hiatus (image 77, series five).  No intraluminal filling defects to suggest dissection.  No periaortic stranding.  There is adequate opacification of the pulmonary arteries with the main pulmonary artery measuring 524 HU.  No filling defect within the pulmonary arteries to suggest acute pulmonary embolism.  The caliber of the main pulmonary artery is  normal.  Heart size is borderline enlarged.  No pericardial effusion.  Review of the MIP images confirms the above findings.  Nonvascular findings:  Unchanged appearance of approximately 11 x 8 mm nodule within the left upper lobe (image 22, series 6), unchanged since the 2008 examination.  Unchanged punctate (2 mm) nodule within the left lower lobe (image 53) also unchanged since the 2008 examination. No new pulmonary nodules.  No focal airspace opacities.  No pleural effusion or pneumothorax.  The central airways are patent.  No mediastinal, hilar or axillary lymphadenopathy.  Early arterial phase evaluation of the upper abdomen demonstrates unchanged dilatation of the common bile duct measuring approximately 6 mm in diameter (image 91) likely sequela of postcholecystectomy state  No acute or aggressive osseous abnormalities.  IMPRESSION:  1.  No acute findings within the chest.  Specifically, no evidence of thoracic aortic dissection or acute pulmonary embolism. 2.  Benign pulmonary nodules within the left upper and lower lobes.  Original Report Authenticated By: Waynard Reeds, M.D.   Dg Chest Portable 1 View 06/27/2011  *RADIOLOGY REPORT*  Clinical Data: Chest pain.  PORTABLE CHEST - 1 VIEW  Comparison: 06/14/2009 CT and 10/12/2008 chest x-ray.  Findings: Stable appearance of nodule projecting adjacent to the aortic arch.  Please see prior CT report.  Central pulmonary vascular prominence stable.  No infiltrate, congestive heart failure or pneumothorax.  Calcified mildly tortuous aorta.  Heart  size top normal.  IMPRESSION: No significant change.  Please see above.  Original Report Authenticated By: Fuller Canada, M.D.     2030:  Pt refused valium.  She has ambulated with steady gait, easy resps. Pt is not orthostatic.  Pt has not been symptomatic while in the ED over several hours of observation.  VS remain stable with SBP's running 140-150's (lowest was 114).  Pt wants to go home now.  T/C to Dr.  Sherwood Gambler, case discussed, including:  HPI, pertinent PM/SHx, VS/PE, dx testing, ED course and treatment:  Agreeable with ED work up for the past 2 days, requests to have pt STOP taking her BP's every 2 hours (only take every 2 days), and call ofc Monday morning to be seen in follow up on Monday.  Dx testing, as well as d/w Dr. Sherwood Gambler, d/w pt and family.  Questions answered.  Verb understanding, agreeable to d/c home with outpt f/u.           Laray Anger, DO 06/30/11 1601

## 2011-06-28 NOTE — ED Notes (Signed)
Patient transported to X-ray 

## 2011-06-28 NOTE — ED Notes (Signed)
Pt called Dr Sherwood Gambler and was told to come to ER for HTN.  Pt arrives in triage clutching chest.  Pt states her chest hurts, has weakness, and tingling all over.

## 2011-06-28 NOTE — Discharge Instructions (Signed)
RESOURCE GUIDE  Dental Problems  Patients with Medicaid: [facility] [phone]                                           [phone] Phone:  [phone]                                                  Phone:  [phone]  If unable to pay or uninsured, contact:  [facility] or [facility]. to become qualified for the adult dental clinic.  Chronic Pain Problems Contact [facility]  [phone] Patients need to be referred by their primary care doctor.  Insufficient Money for Medicine Contact [employer]:  call "211" or [facility] 9044397654.  No Primary Care Doctor Call [facility]  [phone] Other agencies that provide inexpensive medical care    [facility]  [phone]    [facility] Internal Medicine  [phone]    [facility]  [phone]    [facility]  [phone]    [facility]  [phone]    [facility]  [phone]  Psychological Services [facility] Behavioral Health  [phone] [facility]  [phone] [facility] Mental Health   [phone] (emergency services [phone])  Substance Abuse Resources [facility] Services  [phone] [facility] [phone] [facility] [place] [phone] Floydene Flock [phone] Residential & Outpatient Substance Abuse Program  [phone]  Abuse/Neglect [facility] Child Abuse Hotline (838)843-0794 [facility] Child Abuse Hotline 902-155-7723 (After Hours)  Emergency Shelter [facility] [phone]  Maternity Homes Room at the [place] [facility] [phone] [facility] [phone]  MRSA Hotline #:   [phone]    [facility] Resources  [facility]     [employer]                          [facility] Dept. [address] [place]                       [address]      [address]  Blondell Reveal Phone:  [phone]                                   Phone:  [phone]                 Phone:  [phone]  [facility] Mental Health Phone:  [phone]  [facility] Child Abuse Hotline 930 237 [phone] (After Hours)    Take your usual prescriptions as previously directed.  Take your blood pressure only ONCE per day, either in the morning after you take your medicine(s) or in the evening before you go to bed.  Always sit quietly for at least 15 minutes before taking your blood pressure.  Keep a diary of your blood pressures to show your doctor at your follow up office visit.  Call your regular medical doctor on Monday morning to schedule a follow up appointment for Monday.  Return to the Emergency Department immediately sooner if worsening.

## 2011-06-28 NOTE — ED Notes (Signed)
No complaints of being dizzy during orthostatic vital signs. 

## 2011-06-30 ENCOUNTER — Other Ambulatory Visit (HOSPITAL_COMMUNITY): Payer: Self-pay | Admitting: Internal Medicine

## 2011-06-30 ENCOUNTER — Ambulatory Visit (HOSPITAL_COMMUNITY)
Admission: RE | Admit: 2011-06-30 | Discharge: 2011-06-30 | Disposition: A | Discharge status: Home / Self Care | Payer: Medicare Other | Source: Ambulatory Visit | Attending: Internal Medicine | Admitting: Internal Medicine

## 2011-06-30 DIAGNOSIS — M79609 Pain in unspecified limb: Secondary | ICD-10-CM | POA: Insufficient documentation

## 2011-06-30 DIAGNOSIS — I739 Peripheral vascular disease, unspecified: Secondary | ICD-10-CM

## 2011-06-30 DIAGNOSIS — I10 Essential (primary) hypertension: Secondary | ICD-10-CM

## 2011-06-30 DIAGNOSIS — M79606 Pain in leg, unspecified: Secondary | ICD-10-CM

## 2011-06-30 LAB — URINE CULTURE
Colony Count: NO GROWTH
Culture: NO GROWTH

## 2011-12-24 ENCOUNTER — Other Ambulatory Visit (HOSPITAL_COMMUNITY): Payer: Self-pay | Admitting: Internal Medicine

## 2011-12-24 DIAGNOSIS — Z139 Encounter for screening, unspecified: Secondary | ICD-10-CM

## 2012-03-11 ENCOUNTER — Ambulatory Visit (HOSPITAL_COMMUNITY): Payer: Medicare Other

## 2012-03-15 ENCOUNTER — Ambulatory Visit (HOSPITAL_COMMUNITY)
Admission: RE | Admit: 2012-03-15 | Discharge: 2012-03-15 | Disposition: A | Discharge status: Home / Self Care | Payer: Medicare Other | Source: Ambulatory Visit | Attending: Internal Medicine | Admitting: Internal Medicine

## 2012-03-15 DIAGNOSIS — Z1231 Encounter for screening mammogram for malignant neoplasm of breast: Secondary | ICD-10-CM | POA: Insufficient documentation

## 2012-03-15 DIAGNOSIS — Z139 Encounter for screening, unspecified: Secondary | ICD-10-CM

## 2012-06-17 ENCOUNTER — Telehealth: Payer: Self-pay | Admitting: Orthopedic Surgery

## 2012-06-17 NOTE — Telephone Encounter (Signed)
Patient called, states she ran into Dr. Romeo Apple at [facility], although has not been seen in our office since 2010, and had arthroscopic surgery by Dr. Romeo Apple at that time.  She is asking if it is okay to go into the pool at the [facility] with a Baker's cyst.  She states she does not need to come for an appointment.  Her ph# is [phone]

## 2012-06-17 NOTE — Telephone Encounter (Signed)
yes

## 2012-06-17 NOTE — Telephone Encounter (Signed)
It is fine for the patient to go to the [facility] to the pool. That is the best exercise for arthritis.

## 2012-06-21 NOTE — Telephone Encounter (Signed)
Informed patient of Dr. Harrison's reply 

## 2012-07-04 ENCOUNTER — Emergency Department (HOSPITAL_COMMUNITY)
Admission: EM | Admit: 2012-07-04 | Discharge: 2012-07-04 | Disposition: A | Discharge status: Home / Self Care | Payer: Medicare Other | Attending: Emergency Medicine | Admitting: Emergency Medicine

## 2012-07-04 ENCOUNTER — Encounter (HOSPITAL_COMMUNITY): Payer: Self-pay | Admitting: *Deleted

## 2012-07-04 DIAGNOSIS — N390 Urinary tract infection, site not specified: Secondary | ICD-10-CM | POA: Insufficient documentation

## 2012-07-04 DIAGNOSIS — Z79899 Other long term (current) drug therapy: Secondary | ICD-10-CM | POA: Insufficient documentation

## 2012-07-04 DIAGNOSIS — R197 Diarrhea, unspecified: Secondary | ICD-10-CM | POA: Insufficient documentation

## 2012-07-04 DIAGNOSIS — Z90710 Acquired absence of both cervix and uterus: Secondary | ICD-10-CM | POA: Insufficient documentation

## 2012-07-04 DIAGNOSIS — R109 Unspecified abdominal pain: Secondary | ICD-10-CM | POA: Insufficient documentation

## 2012-07-04 DIAGNOSIS — E785 Hyperlipidemia, unspecified: Secondary | ICD-10-CM | POA: Insufficient documentation

## 2012-07-04 DIAGNOSIS — I10 Essential (primary) hypertension: Secondary | ICD-10-CM | POA: Insufficient documentation

## 2012-07-04 DIAGNOSIS — Z7982 Long term (current) use of aspirin: Secondary | ICD-10-CM | POA: Insufficient documentation

## 2012-07-04 DIAGNOSIS — R112 Nausea with vomiting, unspecified: Secondary | ICD-10-CM | POA: Insufficient documentation

## 2012-07-04 LAB — COMPREHENSIVE METABOLIC PANEL
BUN: 24 mg/dL — ABNORMAL HIGH (ref 6–23)
Calcium: 9.3 mg/dL (ref 8.4–10.5)
GFR calc Af Amer: 76 mL/min — ABNORMAL LOW (ref >90)
Glucose, Bld: 158 mg/dL — ABNORMAL HIGH (ref 70–99)
Sodium: 138 mEq/L (ref 135–145)
Total Protein: 7.8 g/dL (ref 6.0–8.3)

## 2012-07-04 LAB — URINALYSIS, ROUTINE W REFLEX MICROSCOPIC
Glucose, UA: NEGATIVE mg/dL
Leukocytes, UA: NEGATIVE
Protein, ur: NEGATIVE mg/dL
pH: 5.5 (ref 5.0–8.0)

## 2012-07-04 LAB — CBC WITH DIFFERENTIAL/PLATELET
HCT: 43.8 % (ref 36.0–46.0)
Hemoglobin: 14.9 g/dL (ref 12.0–15.0)
Lymphocytes Relative: 3 % — ABNORMAL LOW (ref 12–46)
Monocytes Absolute: 0.5 10*3/uL (ref 0.1–1.0)
Monocytes Relative: 4 % (ref 3–12)
Neutro Abs: 11.7 10*3/uL — ABNORMAL HIGH (ref 1.7–7.7)
WBC: 12.6 10*3/uL — ABNORMAL HIGH (ref 4.0–10.5)

## 2012-07-04 LAB — URINE MICROSCOPIC-ADD ON

## 2012-07-04 MED ORDER — DEXTROSE 5 % IV SOLN
1.0000 g | Freq: Once | INTRAVENOUS | Status: AC
  Administered 2012-07-04: 1 g via INTRAVENOUS
  Filled 2012-07-04: qty 10

## 2012-07-04 MED ORDER — SODIUM CHLORIDE 0.9 % IV SOLN
Freq: Once | INTRAVENOUS | Status: AC
  Administered 2012-07-04: 11:00:00 via INTRAVENOUS

## 2012-07-04 MED ORDER — CEPHALEXIN 500 MG PO CAPS: 500.0000 mg | ORAL_CAPSULE | Freq: Three times a day (TID) | ORAL | Status: DC

## 2012-07-04 MED ORDER — ONDANSETRON HCL 4 MG/2ML IJ SOLN
4.0000 mg | Freq: Once | INTRAMUSCULAR | Status: AC
  Administered 2012-07-04: 4 mg via INTRAVENOUS
  Filled 2012-07-04: qty 2

## 2012-07-04 MED ORDER — PANTOPRAZOLE SODIUM 40 MG IV SOLR
40.0000 mg | Freq: Once | INTRAVENOUS | Status: AC
  Administered 2012-07-04: 40 mg via INTRAVENOUS
  Filled 2012-07-04: qty 40

## 2012-07-04 MED ORDER — ACETAMINOPHEN 325 MG PO TABS
650.0000 mg | ORAL_TABLET | Freq: Once | ORAL | Status: AC
  Administered 2012-07-04: 650 mg via ORAL
  Filled 2012-07-04: qty 2

## 2012-07-04 MED ORDER — SODIUM CHLORIDE 0.9 % IV BOLUS (SEPSIS)
500.0000 mL | Freq: Once | INTRAVENOUS | Status: AC
  Administered 2012-07-04: 14:00:00 via INTRAVENOUS

## 2012-07-04 MED ORDER — ONDANSETRON HCL 8 MG PO TABS: 8.0000 mg | ORAL_TABLET | Freq: Three times a day (TID) | ORAL | Status: DC | PRN

## 2012-07-04 MED ORDER — METOCLOPRAMIDE HCL 5 MG/ML IJ SOLN
10.0000 mg | Freq: Once | INTRAMUSCULAR | Status: AC
  Administered 2012-07-04: 10 mg via INTRAVENOUS
  Filled 2012-07-04: qty 2

## 2012-07-04 NOTE — ED Notes (Signed)
Pt states N/V/D began at 0230. Pain also to lower abdomen.

## 2012-07-04 NOTE — ED Provider Notes (Signed)
History  This chart was scribed for Joya Gaskins, MD, by Candelaria Stagers, ED Scribe. This patient was seen in room APA09/APA09 and the patient's care was started at 10:59 AM   CSN: [id number]  Arrival date & time 07/04/12  1034   First MD Initiated Contact with Patient 07/04/12 1044      Chief Complaint  Patient presents with  . Emesis  . Diarrhea  . Abdominal Pain     Patient is a 72 y.o. female presenting with vomiting and diarrhea. The history is provided by the patient. No language interpreter was used.  Emesis Severity:  Mild Timing:  Intermittent Quality:  Bilious material Chronicity:  New Relieved by:  Nothing Worsened by:  Nothing tried Ineffective treatments:  None tried Associated symptoms: abdominal pain and diarrhea   Associated symptoms: no chills   Diarrhea:    Quality:  Watery   Severity:  Mild   Duration:  9 hours   Timing:  Intermittent Risk factors: no sick contacts   Diarrhea Associated symptoms: abdominal pain and vomiting   Associated symptoms: no chills and no fever    Tracy Townsend is a 72 y.o. female who presents to the Emergency Department complaining of sudden onset of nausea, vomiting, diarrhea, and abdominal pain that started around 2:30AM this morning.  She denies blood in stool, fever, chills.  Pt is also experiencing SOB with the nausea.  Pt reports that yesterday she felt tired and weak.  She has tried nothing to relieve the sx.       Past Medical History  Diagnosis Date  . Hypertension   . Hyperlipemia     Past Surgical History  Procedure Laterality Date  . Abdominal hysterectomy    . Cholecystectomy    . Bladder surg    . Knee surgery    . Cardiac catheterization      No family history on file.  History  Substance Use Topics  . Smoking status: Never Smoker   . Smokeless tobacco: Not on file  . Alcohol Use: No    OB History   Grav Para Term Preterm Abortions TAB SAB Ect Mult Living                  Review  of Systems  Constitutional: Negative for fever and chills.  Gastrointestinal: Positive for vomiting, abdominal pain and diarrhea.  All other systems reviewed and are negative.    Allergies  Morphine and related and Latex  Home Medications   Current Outpatient Rx  Name  Route  Sig  Dispense  Refill  . amLODipine (NORVASC) 5 MG tablet   Oral   Take 5 mg by mouth daily.         Marland Kitchen aspirin EC 81 MG tablet   Oral   Take 81 mg by mouth daily.          . fish oil-omega-3 fatty acids 1000 MG capsule   Oral   Take 1 g by mouth 2 (two) times daily.         Marland Kitchen ibuprofen (ADVIL,MOTRIN) 200 MG tablet   Oral   Take 200 mg by mouth 2 (two) times daily as needed.         Marland Kitchen losartan (COZAAR) 50 MG tablet   Oral   Take 100 mg by mouth daily.          . pravastatin (PRAVACHOL) 20 MG tablet   Oral   Take 20 mg by mouth daily.         Marland Kitchen  vitamin B-12 (CYANOCOBALAMIN) 500 MCG tablet   Oral   Take 250 mcg by mouth daily.            BP 141/74  Pulse 80  Temp(Src) 98.1 F (36.7 C) (Oral)  Resp 16  Ht 5\' 2"  (1.575 m)  Wt 130 lb (58.968 kg)  BMI 23.77 kg/m2  SpO2 100%  Physical Exam CONSTITUTIONAL: Well developed/well nourished HEAD: Normocephalic/atraumatic EYES: EOMI/PERRL, no icterus  ENMT: Mucous membranes dry NECK: supple no meningeal signs SPINE:entire spine nontender CV: S1/S2 noted, no murmurs/rubs/gallops noted LUNGS: Lungs are clear to auscultation bilaterally, no apparent distress ABDOMEN: soft, nontender, no rebound or guarding GU:no cva tenderness NEURO: Pt is awake/alert, moves all extremitiesx4 EXTREMITIES: pulses normal, full ROM SKIN: warm, color normal PSYCH: no abnormalities of mood noted  ED Course  Procedures  DIAGNOSTIC STUDIES: Oxygen Saturation is 100% on room air, normal by my interpretation.    COORDINATION OF CARE:  11:01 AM discussed course of care with pt which includes zofran and blood work.  Pt understands and agrees.     Labs Reviewed  COMPREHENSIVE METABOLIC PANEL - Abnormal; Notable for the following:    Glucose, Bld 158 (*)    BUN 24 (*)    GFR calc non Af Amer 65 (*)    GFR calc Af Amer 76 (*)    All other components within normal limits  CBC WITH DIFFERENTIAL - Abnormal; Notable for the following:    WBC 12.6 (*)    RBC 5.19 (*)    Neutrophils Relative 93 (*)    Neutro Abs 11.7 (*)    Lymphocytes Relative 3 (*)    Lymphs Abs 0.4 (*)    All other components within normal limits  LIPASE, BLOOD  LACTIC ACID, PLASMA    12:02 PM Pt feeling improved She is in no distress She reports both vomiting and diarrhea, abrupt in onset, suspect viral GI illness Will give another dose of zofran and try PO tylenol 3:23 PM Pt improved, taking PO Her abdomen is soft to palpation uti noted, rocephin given Stable for d/c We discussed strict return precautions   MDM  Nursing notes including past medical history and social history reviewed and considered in documentation Labs/vital reviewed and considered     Date: 07/04/2012  Rate: 77  Rhythm: normal sinus rhythm  QRS Axis: left  Intervals: normal  ST/T Wave abnormalities: nonspecific ST changes  Conduction Disutrbances:none  Narrative Interpretation:   Old EKG Reviewed: unchanged from 06/28/2011    I personally performed the services described in this documentation, which was scribed in my presence. The recorded information has been reviewed and is accurate.          Joya Gaskins, MD 07/04/12 1524

## 2012-07-04 NOTE — ED Notes (Signed)
Pt presents to er with n/v/d that started during the night, is associated with lower abd cramping and sob, pt states that she felt "jittery" yesterday but denies any other symptoms until the n/v/d started around 4"30 this am,

## 2012-07-04 NOTE — ED Notes (Addendum)
Pt given ice chips, warm blankets, ,

## 2012-07-04 NOTE — ED Notes (Signed)
Pt has one episode of n/v Dr. Bebe Shaggy notified,

## 2012-07-06 LAB — URINE CULTURE

## 2012-07-07 ENCOUNTER — Telehealth (HOSPITAL_COMMUNITY): Payer: Self-pay | Admitting: Emergency Medicine

## 2012-12-24 ENCOUNTER — Other Ambulatory Visit (HOSPITAL_COMMUNITY): Payer: Self-pay | Admitting: Internal Medicine

## 2012-12-24 DIAGNOSIS — Z139 Encounter for screening, unspecified: Secondary | ICD-10-CM

## 2013-03-21 ENCOUNTER — Ambulatory Visit (HOSPITAL_COMMUNITY)
Admission: RE | Admit: 2013-03-21 | Discharge: 2013-03-21 | Disposition: A | Discharge status: Home / Self Care | Payer: Medicare Other | Source: Ambulatory Visit | Attending: Internal Medicine | Admitting: Internal Medicine

## 2013-03-21 DIAGNOSIS — Z1231 Encounter for screening mammogram for malignant neoplasm of breast: Secondary | ICD-10-CM | POA: Insufficient documentation

## 2013-03-21 DIAGNOSIS — Z139 Encounter for screening, unspecified: Secondary | ICD-10-CM

## 2013-03-22 ENCOUNTER — Ambulatory Visit (HOSPITAL_COMMUNITY): Payer: Medicare Other

## 2013-07-14 ENCOUNTER — Ambulatory Visit (INDEPENDENT_AMBULATORY_CARE_PROVIDER_SITE_OTHER): Payer: Medicare Other | Admitting: Orthopedic Surgery

## 2013-07-14 ENCOUNTER — Encounter: Payer: Self-pay | Admitting: Orthopedic Surgery

## 2013-07-14 ENCOUNTER — Ambulatory Visit (INDEPENDENT_AMBULATORY_CARE_PROVIDER_SITE_OTHER): Payer: Medicare Other

## 2013-07-14 VITALS — BP 136/86 | Ht 60.0 in | Wt 129.0 lb

## 2013-07-14 DIAGNOSIS — M25569 Pain in unspecified knee: Secondary | ICD-10-CM

## 2013-07-14 DIAGNOSIS — M1712 Unilateral primary osteoarthritis, left knee: Secondary | ICD-10-CM

## 2013-07-14 DIAGNOSIS — M1710 Unilateral primary osteoarthritis, unspecified knee: Secondary | ICD-10-CM

## 2013-07-14 DIAGNOSIS — IMO0002 Reserved for concepts with insufficient information to code with codable children: Secondary | ICD-10-CM

## 2013-07-14 DIAGNOSIS — M25562 Pain in left knee: Secondary | ICD-10-CM

## 2013-07-14 MED ORDER — HYDROCODONE-ACETAMINOPHEN 5-325 MG PO TABS: 1.0000 | ORAL_TABLET | Freq: Four times a day (QID) | ORAL | Status: DC | PRN

## 2013-07-14 NOTE — Patient Instructions (Signed)
Take 2 Aleve daily Wear brace at all times while up moving around

## 2013-07-14 NOTE — Progress Notes (Signed)
Patient ID: Tracy Townsend, female   DOB: 12-16-1940, 73 y.o.   MRN: [id number]  Chief Complaint  Patient presents with  . Knee Pain    Left knee pain, no recent injury    73 year old female presents with a history of long-standing left knee pain but increased knee pain after turning and pivoting on the left knee about 2 months ago. Now complains of severe 8/10 constant pain which is worse with ambulation associated with catching sensation sharp throbbing stabbing anterior medial knee pain  Previous right knee arthroscopy cholecystectomy bladder tacking hysterectomy hemorrhoid surgery.  History of hypertension  Medications pravastatin losartan amlodipine Aleve  Family history heart disease cancer  Social history widowed retired does not smoke or drink  System review fatigue easy bruising cold intolerance all other systems reviewed were normal  BP 136/86  Ht 5' (1.524 m)  Wt 129 lb (58.514 kg)  BMI 25.19 kg/m2 General appearance is normal, the patient is alert and oriented x3 with mood and affect weepy  She seems to exacerbate the symptoms on palpation although I think she has true pathology. She talks to me normal umbilicus the knee she screamed out in pain  No swelling Tenderness globally around the knee, medial lateral joint line patella tendon quadriceps tendon patella Knee flexion 125 Ligament stable in all planes, her exam normal, skin intact.  Mild peripheral edema intact pulses normal sensation  X-rays show severe arthritis  She'll need a knee replacement as definitive care but she is so histrionic right now I don't think she could tolerate any surgery and I think she is an acute knee injury on top of an old or chronic knee arthritis  Recommend brace/economy hinged brace Aleve twice a day Norco every 6 hours 5 mg The knee reevaluate this in a week

## 2013-07-19 ENCOUNTER — Ambulatory Visit: Payer: Medicare Other | Admitting: Orthopedic Surgery

## 2013-07-21 ENCOUNTER — Ambulatory Visit (INDEPENDENT_AMBULATORY_CARE_PROVIDER_SITE_OTHER): Payer: Medicare Other | Admitting: Orthopedic Surgery

## 2013-07-21 VITALS — BP 118/89 | Ht 60.0 in | Wt 129.0 lb

## 2013-07-21 DIAGNOSIS — M1710 Unilateral primary osteoarthritis, unspecified knee: Secondary | ICD-10-CM

## 2013-07-21 DIAGNOSIS — IMO0002 Reserved for concepts with insufficient information to code with codable children: Secondary | ICD-10-CM

## 2013-07-21 DIAGNOSIS — M1712 Unilateral primary osteoarthritis, left knee: Secondary | ICD-10-CM

## 2013-07-21 DIAGNOSIS — M25562 Pain in left knee: Secondary | ICD-10-CM

## 2013-07-21 DIAGNOSIS — M25569 Pain in unspecified knee: Secondary | ICD-10-CM

## 2013-07-21 NOTE — Progress Notes (Signed)
Patient ID: MAKENSIE MULHALL, female   DOB: 11/08/1940, 73 y.o.   MRN: [id number]  Chief Complaint  Patient presents with  . Follow-up    one week recheck left knee    HISTORY: Chief Complaint   Patient presents with   .  Knee Pain       Left knee pain, no recent injury     74 year old female presents with a history of long-standing left knee pain but increased knee pain after turning and pivoting on the left knee about 2 months ago. Now complains of severe 8/10 constant pain which is worse with ambulation associated with catching sensation sharp throbbing stabbing anterior medial knee pain   History noted above. Patient has improved with bracing. Comes in today not better walking with a cane and a brace. Review of systems no catching locking or giving way  Vital signs BP 118/89  Ht 5' (1.524 m)  Wt 129 lb (58.514 kg)  BMI 25.19 kg/m2 General appearance is normal, the patient is alert and oriented x3 with normal mood and affect.  The knee looks good her mood is better she has full extension mild pain over the medial joint line but much improved, and he remained stable   Encounter Diagnoses  Name Primary?  . Left knee pain   . Osteoarthritis of left knee Yes     Recommend continue bracing return in about a month for reevaluation.

## 2013-07-21 NOTE — Patient Instructions (Signed)
WEAR BRACE X 1 MONTH THEN RETURN

## 2013-08-23 ENCOUNTER — Ambulatory Visit (INDEPENDENT_AMBULATORY_CARE_PROVIDER_SITE_OTHER): Payer: Medicare Other | Admitting: Orthopedic Surgery

## 2013-08-23 VITALS — BP 140/79 | Ht 60.0 in | Wt 129.0 lb

## 2013-08-23 DIAGNOSIS — M1710 Unilateral primary osteoarthritis, unspecified knee: Secondary | ICD-10-CM

## 2013-08-23 DIAGNOSIS — M1712 Unilateral primary osteoarthritis, left knee: Secondary | ICD-10-CM

## 2013-08-23 DIAGNOSIS — M25562 Pain in left knee: Secondary | ICD-10-CM

## 2013-08-23 DIAGNOSIS — IMO0002 Reserved for concepts with insufficient information to code with codable children: Secondary | ICD-10-CM

## 2013-08-23 DIAGNOSIS — M25569 Pain in unspecified knee: Secondary | ICD-10-CM

## 2013-08-23 NOTE — Progress Notes (Signed)
Patient ID: Tracy Townsend, female   DOB: [birth year], 73 y.o.   MRN: [id number] Chief Complaint  Patient presents with  . Follow-up    one month recheck left knee   BP 140/79  Ht 5' (1.524 m)  Wt 129 lb (58.514 kg)  BMI 25.19 kg/m2 Chief Complaint   Patient presents with   .  Knee Pain       Left knee pain, no recent injury     73 year old female presents with a history of long-standing left knee pain but increased knee pain after turning and pivoting on the left knee about 2 months ago. Now complains of severe 8/10 constant pain which is worse with ambulation associated with catching sensation sharp throbbing stabbing anterior medial knee pain   She reports no pain in her knee at this time  No catching locking giving way  Left knee Lower extremity exam General appearance is normal, the patient is alert and oriented x3 with normal mood and affect.   Ambulation is normal.  Inspection and palpation revealed no tenderness or abnormality in alignment in the lower extremities. Range of motion is full.  Strength is grade 5.  All joints are stable.  Call or return to clinic prn if these symptoms worsen or fail to improve as anticipated.

## 2013-10-25 ENCOUNTER — Other Ambulatory Visit (HOSPITAL_COMMUNITY): Payer: Self-pay | Admitting: Internal Medicine

## 2013-10-25 DIAGNOSIS — Z1231 Encounter for screening mammogram for malignant neoplasm of breast: Secondary | ICD-10-CM

## 2013-11-24 ENCOUNTER — Ambulatory Visit (HOSPITAL_COMMUNITY)
Admission: RE | Admit: 2013-11-24 | Discharge: 2013-11-24 | Disposition: A | Discharge status: Home / Self Care | Payer: Medicare Other | Source: Ambulatory Visit | Attending: Internal Medicine | Admitting: Internal Medicine

## 2013-11-24 ENCOUNTER — Other Ambulatory Visit (HOSPITAL_COMMUNITY): Payer: Self-pay | Admitting: Internal Medicine

## 2013-11-24 DIAGNOSIS — M810 Age-related osteoporosis without current pathological fracture: Secondary | ICD-10-CM

## 2013-11-24 NOTE — Discharge Instructions (Signed)
Bone Densitometry Bone densitometry is a special X-ray that measures your bone density and can be used to help predict your risk of bone fractures. This test is used to determine bone mineral content and density to diagnose osteoporosis. Osteoporosis is the loss of bone that may cause the bone to become weak. Osteoporosis commonly occurs in women entering menopause. However, it may be found in men and in people with other diseases. PREPARATION FOR TEST No preparation necessary. WHO SHOULD BE TESTED?  All women older than 75.  Postmenopausal women (50 to 72) with risk factors for osteoporosis.  People with a previous fracture caused by normal activities.  People with a small body frame (less than 127 poundsor a body mass index [BMI] of less than 21).  People who have a parent with a hip fracture or history of osteoporosis.  People who smoke.  People who have rheumatoid arthritis.  Anyone who engages in excessive alcohol use (more than 3 drinks most days).  Women who experience early menopause. WHEN SHOULD YOU BE RETESTED? Current guidelines suggest that you should wait at least 2 years before doing a bone density test again if your first test was normal.Recent studies indicated that women with normal bone density may be able to wait a few years before needing to repeat a bone density test. You should discuss this with your caregiver.  NORMAL FINDINGS   Normal: less than standard deviation below normal (greater than -1).  Osteopenia: 1 to 2.5 standard deviations below normal (-1 to -2.5).  Osteoporosis: greater than 2.5 standard deviations below normal (less than -2.5). Test results are reported as a "T score" and a "Z score."The T score is a number that compares your bone density with the bone density of healthy, young women.The Z score is a number that compares your bone density with the scores of women who are the same age, gender, and race.  Ranges for normal findings may vary  among different laboratories and hospitals. You should always check with your doctor after having lab work or other tests done to discuss the meaning of your test results and whether your values are considered within normal limits. MEANING OF TEST  Your caregiver will go over the test results with you and discuss the importance and meaning of your results, as well as treatment options and the need for additional tests if necessary. OBTAINING THE TEST RESULTS It is your responsibility to obtain your test results. Ask the lab or department performing the test when and how you will get your results. Document Released: 04/01/2004 Document Revised: 06/02/2011 Document Reviewed: 04/24/2010 [facility] Patient Information 2015 [place], [place]. This information is not intended to replace advice given to you by your health care provider. Make sure you discuss any questions you have with your health care provider.  Osteoporosis Osteoporosis happens when your bones become weak because of bone loss. Weak bones can break (fracture) more easily with slips or falls. You are more likely to develop osteoporosis if:  You are a woman.  You are older than 50 years.  You are white or Asian.  You are very thin.  Someone in your family has had osteoporosis.  You smoke or use nicotine. CAUSES   Smoking.  Too much drinking.  Being a weight below normal.  Not being active.  Not going outside in the sun enough.  Certain medical conditions, such as diabetes or Crohn disease.  Certain medicines, such as steroids or antiseizure medicines. TREATMENT  The goal of treatment  is to strengthen bones. There are different types of medicines that help your bones. Some medicines make your bones more solid. Some medicines help to slow down how much bone you lose. Your doctor may check to see if you are getting enough calcium and vitamin D in your diet. PREVENTION   Make sure you get enough calcium and vitamin  D.  Make sure you exercise often.  If you smoke, quit. MAKE SURE YOU:  Understand these instructions.  Will watch your condition.  Will get help right away if you are not doing well or get worse. Document Released: 06/02/2011 Document Reviewed: 06/02/2011 [facility] Patient Information 2015 [place]. This information is not intended to replace advice given to you by your health care provider. Make sure you discuss any questions you have with your health care provider.

## 2014-03-22 ENCOUNTER — Ambulatory Visit (HOSPITAL_COMMUNITY)
Admission: RE | Admit: 2014-03-22 | Discharge: 2014-03-22 | Disposition: A | Discharge status: Home / Self Care | Payer: Medicare Other | Source: Ambulatory Visit | Attending: Internal Medicine | Admitting: Internal Medicine

## 2014-03-22 DIAGNOSIS — Z1231 Encounter for screening mammogram for malignant neoplasm of breast: Secondary | ICD-10-CM | POA: Diagnosis present

## 2014-06-05 ENCOUNTER — Ambulatory Visit (INDEPENDENT_AMBULATORY_CARE_PROVIDER_SITE_OTHER): Payer: PPO | Admitting: Orthopedic Surgery

## 2014-06-05 ENCOUNTER — Encounter: Payer: Self-pay | Admitting: Orthopedic Surgery

## 2014-06-05 VITALS — BP 134/96 | Ht 60.0 in | Wt 129.0 lb

## 2014-06-05 DIAGNOSIS — M170 Bilateral primary osteoarthritis of knee: Secondary | ICD-10-CM

## 2014-06-05 NOTE — Patient Instructions (Signed)
Joint Injection  Care After  Refer to this sheet in the next few days. These instructions provide you with information on caring for yourself after you have had a joint injection. Your caregiver also may give you more specific instructions. Your treatment has been planned according to current medical practices, but problems sometimes occur. Call your caregiver if you have any problems or questions after your procedure.  After any type of joint injection, it is not uncommon to experience:  · Soreness, swelling, or bruising around the injection site.  · Mild numbness, tingling, or weakness around the injection site caused by the numbing medicine used before or with the injection.  It also is possible to experience the following effects associated with the specific agent after injection:  · Iodine-based contrast agents:  ¨ Allergic reaction (itching, hives, widespread redness, and swelling beyond the injection site).  · Corticosteroids (These effects are rare.):  ¨ Allergic reaction.  ¨ Increased blood sugar levels (If you have diabetes and you notice that your blood sugar levels have increased, notify your caregiver).  ¨ Increased blood pressure levels.  ¨ Mood swings.  · Hyaluronic acid in the use of viscosupplementation.  ¨ Temporary heat or redness.  ¨ Temporary rash and itching.  ¨ Increased fluid accumulation in the injected joint.  These effects all should resolve within a day after your procedure.   HOME CARE INSTRUCTIONS  · Limit yourself to light activity the day of your procedure. Avoid lifting heavy objects, bending, stooping, or twisting.  · Take prescription or over-the-counter pain medication as directed by your caregiver.  · You may apply ice to your injection site to reduce pain and swelling the day of your procedure. Ice may be applied 03-04 times:  ¨ Put ice in a plastic bag.  ¨ Place a towel between your skin and the bag.  ¨ Leave the ice on for no longer than 15-20 minutes each time.  SEEK  IMMEDIATE MEDICAL CARE IF:   · Pain and swelling get worse rather than better or extend beyond the injection site.  · Numbness does not go away.  · Blood or fluid continues to leak from the injection site.  · You have chest pain.  · You have swelling of your face or tongue.  · You have trouble breathing or you become dizzy.  · You develop a fever, chills, or severe tenderness at the injection site that last longer than 1 day.  MAKE SURE YOU:  · Understand these instructions.  · Watch your condition.  · Get help right away if you are not doing well or if you get worse.  Document Released: 11/21/2010 Document Revised: 06/02/2011 Document Reviewed: 11/21/2010  Exit[employer]® Patient Information ©2015 [employer]. This information is not intended to replace advice given to you by your health care provider. Make sure you discuss any questions you have with your health care provider.

## 2014-06-05 NOTE — Progress Notes (Signed)
Patient ID: Tracy Townsend, female   DOB: 1941/03/13, 74 y.o.   MRN: [id number] Chief Complaint  Patient presents with  . Knee Pain    requests bilateral knee injections   BP 134/96 mmHg  Ht 5' (1.524 m)  Wt 129 lb (58.514 kg)  BMI 25.19 kg/m2  Procedure note right knee injection verbal consent was obtained to inject right knee joint  Timeout was completed to confirm the site of injection  The medications used were 40 mg of Depo-Medrol and 1% lidocaine 3 cc  Anesthesia was provided by ethyl chloride and the skin was prepped with alcohol.  After cleaning the skin with alcohol a 20-gauge needle was used to inject the right knee joint. There were no complications. A sterile bandage was applied.  Procedure note left knee injection verbal consent was obtained to inject left knee joint  Timeout was completed to confirm the site of injection  The medications used were 40 mg of Depo-Medrol and 1% lidocaine 3 cc  Anesthesia was provided by ethyl chloride and the skin was prepped with alcohol.  After cleaning the skin with alcohol a 20-gauge needle was used to inject the left knee joint. There were no complications. A sterile bandage was applied.

## 2014-06-21 ENCOUNTER — Encounter (INDEPENDENT_AMBULATORY_CARE_PROVIDER_SITE_OTHER): Payer: Self-pay | Admitting: *Deleted

## 2014-06-21 ENCOUNTER — Encounter (INDEPENDENT_AMBULATORY_CARE_PROVIDER_SITE_OTHER): Payer: Self-pay

## 2014-07-11 ENCOUNTER — Other Ambulatory Visit (INDEPENDENT_AMBULATORY_CARE_PROVIDER_SITE_OTHER): Payer: Self-pay | Admitting: *Deleted

## 2014-07-11 DIAGNOSIS — Z800 Family history of malignant neoplasm of digestive organs: Secondary | ICD-10-CM

## 2014-07-21 ENCOUNTER — Telehealth (INDEPENDENT_AMBULATORY_CARE_PROVIDER_SITE_OTHER): Payer: Self-pay | Admitting: *Deleted

## 2014-07-21 DIAGNOSIS — Z1211 Encounter for screening for malignant neoplasm of colon: Secondary | ICD-10-CM

## 2014-07-21 MED ORDER — PEG 3350-KCL-NA BICARB-NACL 420 G PO SOLR: 4000.0000 mL | Freq: Once | ORAL | Status: DC

## 2014-07-21 NOTE — Telephone Encounter (Signed)
Patient needs trilyte 

## 2014-08-08 ENCOUNTER — Telehealth (INDEPENDENT_AMBULATORY_CARE_PROVIDER_SITE_OTHER): Payer: Self-pay | Admitting: *Deleted

## 2014-08-08 NOTE — Telephone Encounter (Signed)
Referring MD/PCP: fusco   Procedure: tcs  Reason/Indication:  fam hx colon ca  Has patient had this procedure before?  Yes, 2011 -- scanned  If so, when, by whom and where?    Is there a family history of colon cancer?  Yes, maternal grandmother  Who?  What age when diagnosed?    Is patient diabetic?   no      Does patient have prosthetic heart valve?  no  Do you have a pacemaker?  no  Has patient ever had endocarditis? no  Has patient had joint replacement within last 12 months?  no  Does patient tend to be constipated or take laxatives? no  Is patient on Coumadin, Plavix and/or Aspirin? yes  Medications: pravastatin 20 mg daily, losartan 50 mg daily, amlodipine 5 mg daily, asa 81 mg daily, fish oil, vit c, calcium 600 mg daily, vit b12 daily  Allergies: morphine, latex  Medication Adjustment: asa 2 days  Procedure date & time: 09/07/14 at 730

## 2014-08-09 NOTE — Telephone Encounter (Signed)
agree

## 2014-09-07 ENCOUNTER — Ambulatory Visit (HOSPITAL_COMMUNITY)
Admission: RE | Admit: 2014-09-07 | Discharge: 2014-09-07 | Disposition: A | Discharge status: Home / Self Care | Payer: PPO | Source: Ambulatory Visit | Attending: Internal Medicine | Admitting: Internal Medicine

## 2014-09-07 ENCOUNTER — Encounter (HOSPITAL_COMMUNITY): Payer: Self-pay | Admitting: *Deleted

## 2014-09-07 ENCOUNTER — Encounter (HOSPITAL_COMMUNITY)
Admission: RE | Disposition: A | Discharge status: Home / Self Care | Payer: Self-pay | Source: Ambulatory Visit | Attending: Internal Medicine

## 2014-09-07 DIAGNOSIS — Z800 Family history of malignant neoplasm of digestive organs: Secondary | ICD-10-CM

## 2014-09-07 DIAGNOSIS — Z801 Family history of malignant neoplasm of trachea, bronchus and lung: Secondary | ICD-10-CM | POA: Insufficient documentation

## 2014-09-07 DIAGNOSIS — Z8042 Family history of malignant neoplasm of prostate: Secondary | ICD-10-CM | POA: Insufficient documentation

## 2014-09-07 DIAGNOSIS — Z885 Allergy status to narcotic agent status: Secondary | ICD-10-CM | POA: Insufficient documentation

## 2014-09-07 DIAGNOSIS — K644 Residual hemorrhoidal skin tags: Secondary | ICD-10-CM | POA: Diagnosis not present

## 2014-09-07 DIAGNOSIS — E785 Hyperlipidemia, unspecified: Secondary | ICD-10-CM | POA: Insufficient documentation

## 2014-09-07 DIAGNOSIS — Z1211 Encounter for screening for malignant neoplasm of colon: Secondary | ICD-10-CM | POA: Insufficient documentation

## 2014-09-07 DIAGNOSIS — Z91040 Latex allergy status: Secondary | ICD-10-CM | POA: Diagnosis not present

## 2014-09-07 DIAGNOSIS — K5730 Diverticulosis of large intestine without perforation or abscess without bleeding: Secondary | ICD-10-CM | POA: Insufficient documentation

## 2014-09-07 DIAGNOSIS — I10 Essential (primary) hypertension: Secondary | ICD-10-CM | POA: Diagnosis not present

## 2014-09-07 DIAGNOSIS — K6289 Other specified diseases of anus and rectum: Secondary | ICD-10-CM | POA: Diagnosis not present

## 2014-09-07 DIAGNOSIS — Z808 Family history of malignant neoplasm of other organs or systems: Secondary | ICD-10-CM | POA: Diagnosis not present

## 2014-09-07 DIAGNOSIS — Z888 Allergy status to other drugs, medicaments and biological substances status: Secondary | ICD-10-CM | POA: Diagnosis not present

## 2014-09-07 HISTORY — PX: COLONOSCOPY: SHX5424

## 2014-09-07 SURGERY — COLONOSCOPY
Anesthesia: Moderate Sedation

## 2014-09-07 MED ORDER — MIDAZOLAM HCL 5 MG/5ML IJ SOLN
INTRAMUSCULAR | Status: AC
  Filled 2014-09-07: qty 10

## 2014-09-07 MED ORDER — STERILE WATER FOR IRRIGATION IR SOLN
Status: DC | PRN
  Administered 2014-09-07: 08:00:00

## 2014-09-07 MED ORDER — MEPERIDINE HCL 50 MG/ML IJ SOLN
INTRAMUSCULAR | Status: DC | PRN
  Administered 2014-09-07 (×2): 25 mg via INTRAVENOUS

## 2014-09-07 MED ORDER — LIDOCAINE HCL 2 % EX GEL
CUTANEOUS | Status: AC
  Filled 2014-09-07: qty 30

## 2014-09-07 MED ORDER — MEPERIDINE HCL 50 MG/ML IJ SOLN
INTRAMUSCULAR | Status: AC
  Filled 2014-09-07: qty 1

## 2014-09-07 MED ORDER — MIDAZOLAM HCL 5 MG/5ML IJ SOLN
INTRAMUSCULAR | Status: DC | PRN
  Administered 2014-09-07: 2 mg via INTRAVENOUS
  Administered 2014-09-07 (×3): 1 mg via INTRAVENOUS

## 2014-09-07 MED ORDER — SODIUM CHLORIDE 0.9 % IV SOLN
INTRAVENOUS | Status: DC
Start: 2014-09-07 — End: 2014-09-07
  Administered 2014-09-07: 1000 mL via INTRAVENOUS

## 2014-09-07 NOTE — H&P (Signed)
Tracy Townsend is an 74 y.o. female.   Chief Complaint: Patient is here for colonoscopy. HPI: Patient is 74 year old Caucasian female who is here for high risk screening colonoscopy. Her last exam was in April 2011 at [facility] in [facility]. She denies abdominal pain change in bowel habits or rectal bleeding. Family history significant for CRC in grandmother who was diagnosed the [place] at age 35. She lost 3 first cousins of CRC ages 30 and 56. Family history significant for prostate carcinoma and a brother who is deceased; he does not at age 33. Mother died of melanoma in father lung carcinoma and she has 2 more cousins with prostate CA.  Past Medical History  Diagnosis Date  . Hypertension   . Hyperlipemia     Past Surgical History  Procedure Laterality Date  . Abdominal hysterectomy    . Cholecystectomy    . Bladder surg    . Knee surgery    . Cardiac catheterization      History reviewed. No pertinent family history. Social History:  reports that she has never smoked. She does not have any smokeless tobacco history on file. She reports that she does not drink alcohol or use illicit drugs.  Allergies:  Allergies  Allergen Reactions  . Morphine And Related Nausea And Vomiting  . Latex Rash    Medications Prior to Admission  Medication Sig Dispense Refill  . amLODipine (NORVASC) 5 MG tablet Take 5 mg by mouth daily.    . fish oil-omega-3 fatty acids 1000 MG capsule Take 1 g by mouth 2 (two) times daily.    Marland Kitchen ibuprofen (ADVIL,MOTRIN) 200 MG tablet Take 200 mg by mouth 2 (two) times daily as needed.    Marland Kitchen losartan (COZAAR) 50 MG tablet Take 100 mg by mouth daily.     . Multiple Vitamins-Calcium (ONE-A-DAY WOMENS PO) Take 1 tablet by mouth daily.    . polyethylene glycol-electrolytes (NULYTELY/GOLYTELY) 420 G solution Take 4,000 mLs by mouth once. 4000 mL 0  . pravastatin (PRAVACHOL) 20 MG tablet Take 20 mg by mouth daily.    . vitamin B-12 (CYANOCOBALAMIN) 500 MCG tablet  Take 250 mcg by mouth daily.       No results found for this or any previous visit (from the past 48 hour(s)). No results found.  ROS  Blood pressure 139/76, pulse 74, temperature 97.9 F (36.6 C), temperature source Oral, resp. rate 19, height 5' 1.5" (1.562 m), weight 135 lb (61.236 kg), SpO2 97 %. Physical Exam  Constitutional: She appears well-developed and well-nourished.  HENT:  Mouth/Throat: Oropharynx is clear and moist.  Eyes: Conjunctivae are normal. No scleral icterus.  Neck: No thyromegaly present.  Cardiovascular: Normal rate, regular rhythm and normal heart sounds.   No murmur heard. Respiratory: Effort normal and breath sounds normal.  GI: Soft. She exhibits no distension. There is no tenderness.  Musculoskeletal: She exhibits no edema.  Lymphadenopathy:    She has no cervical adenopathy.  Neurological: She is alert.  Skin: Skin is warm and dry.     Assessment/Plan High risk screening colonoscopy.  REHMAN,NAJEEB U 09/07/2014, 7:33 AM

## 2014-09-07 NOTE — Discharge Instructions (Signed)
Resume usual medications and high fiber diet. No driving for 24 hours. Next screening exam in 5 years.   Colonoscopy, Care After Refer to this sheet in the next few weeks. These instructions provide you with information on caring for yourself after your procedure. Your health care provider may also give you more specific instructions. Your treatment has been planned according to current medical practices, but problems sometimes occur. Call your health care provider if you have any problems or questions after your procedure. WHAT TO EXPECT AFTER THE PROCEDURE  After your procedure, it is typical to have the following:  A small amount of blood in your stool.  Moderate amounts of gas and mild abdominal cramping or bloating. HOME CARE INSTRUCTIONS  Do not drive, operate machinery, or sign important documents for 24 hours.  You may shower and resume your regular physical activities, but move at a slower pace for the first 24 hours.  Take frequent rest periods for the first 24 hours.  Walk around or put a warm pack on your abdomen to help reduce abdominal cramping and bloating.  Drink enough fluids to keep your urine clear or pale yellow.  You may resume your normal diet as instructed by your health care provider. Avoid heavy or fried foods that are hard to digest.  Avoid drinking alcohol for 24 hours or as instructed by your health care provider.  Only take over-the-counter or prescription medicines as directed by your health care provider.  If a tissue sample (biopsy) was taken during your procedure:  Do not take aspirin or blood thinners for 7 days, or as instructed by your health care provider.  Do not drink alcohol for 7 days, or as instructed by your health care provider.  Eat soft foods for the first 24 hours. SEEK MEDICAL CARE IF: You have persistent spotting of blood in your stool 2-3 days after the procedure. SEEK IMMEDIATE MEDICAL CARE IF:  You have more than a small  spotting of blood in your stool.  You pass large blood clots in your stool.  Your abdomen is swollen (distended).  You have nausea or vomiting.  You have a fever.  You have increasing abdominal pain that is not relieved with medicine. Document Released: 10/23/2003 Document Revised: 12/29/2012 Document Reviewed: 11/15/2012 [facility] Patient Information 2015 [place], [place]. This information is not intended to replace advice given to you by your health care provider. Make sure you discuss any questions you have with your health care provider.  High-Fiber Diet Fiber is found in fruits, vegetables, and grains. A high-fiber diet encourages the addition of more whole grains, legumes, fruits, and vegetables in your diet. The recommended amount of fiber for adult males is 38 g per day. For adult females, it is 25 g per day. Pregnant and lactating women should get 28 g of fiber per day. If you have a digestive or bowel problem, ask your caregiver for advice before adding high-fiber foods to your diet. Eat a variety of high-fiber foods instead of only a select few type of foods.  PURPOSE  To increase stool bulk.  To make bowel movements more regular to prevent constipation.  To lower cholesterol.  To prevent overeating. WHEN IS THIS DIET USED?  It may be used if you have constipation and hemorrhoids.  It may be used if you have uncomplicated diverticulosis (intestine condition) and irritable bowel syndrome.  It may be used if you need help with weight management.  It may be used if you want  add it to your diet as a protective measure against atherosclerosis, diabetes, and cancer. °SOURCES OF FIBER °· Whole-grain breads and cereals. °· Fruits, such as apples, oranges, bananas, berries, prunes, and pears. °· Vegetables, such as green peas, carrots, sweet potatoes, beets, broccoli, cabbage, spinach, and artichokes. °· Legumes, such split peas, soy, lentils. °· Almonds. °FIBER CONTENT IN  FOODS °Starches and Grains / Dietary Fiber (g) °· Cheerios, 1 cup / 3 g °· Corn Flakes cereal, 1 cup / 0.7 g °· Rice crispy treat cereal, 1¼ cup / 0.3 g °· Instant oatmeal (cooked), ½ cup / 2 g °· Frosted wheat cereal, 1 cup / 5.1 g °· Brown, long-grain rice (cooked), 1 cup / 3.5 g °· White, long-grain rice (cooked), 1 cup / 0.6 g °· Enriched macaroni (cooked), 1 cup / 2.5 g °Legumes / Dietary Fiber (g) °· Baked beans (canned, plain, or vegetarian), ½ cup / 5.2 g °· Kidney beans (canned), ½ cup / 6.8 g °· Pinto beans (cooked), ½ cup / 5.5 g °Breads and Crackers / Dietary Fiber (g) °· Plain or honey graham crackers, 2 squares / 0.7 g °· Saltine crackers, 3 squares / 0.3 g °· Plain, salted pretzels, 10 pieces / 1.8 g °· Whole-wheat bread, 1 slice / 1.9 g °· White bread, 1 slice / 0.7 g °· Raisin bread, 1 slice / 1.2 g °· Plain bagel, 3 oz / 2 g °· Flour tortilla, 1 oz / 0.9 g °· Corn tortilla, 1 small / 1.5 g °· Hamburger or hotdog bun, 1 small / 0.9 g °Fruits / Dietary Fiber (g) °· Apple with skin, 1 medium / 4.4 g °· Sweetened applesauce, ½ cup / 1.5 g °· Banana, ½ medium / 1.5 g °· Grapes, 10 grapes / 0.4 g °· Orange, 1 small / 2.3 g °· Raisin, 1.5 oz / 1.6 g °· Melon, 1 cup / 1.4 g °Vegetables / Dietary Fiber (g) °· Green beans (canned), ½ cup / 1.3 g °· Carrots (cooked), ½ cup / 2.3 g °· Broccoli (cooked), ½ cup / 2.8 g °· Peas (cooked), ½ cup / 4.4 g °· Mashed potatoes, ½ cup / 1.6 g °· Lettuce, 1 cup / 0.5 g °· Corn (canned), ½ cup / 1.6 g °· Tomato, ½ cup / 1.1 g °Document Released: 03/10/2005 Document Revised: 09/09/2011 Document Reviewed: 06/12/2011 °[employer]® Patient Information ©2015 [employer]. This information is not intended to replace advice given to you by your health care provider. Make sure you discuss any questions you have with your health care provider. ° °

## 2014-09-07 NOTE — Op Note (Signed)
COLONOSCOPY PROCEDURE REPORT  PATIENT:  Tracy Townsend  MR#:  [id number] Birthdate:  1941-02-25, 74 y.o., female Endoscopist:  Dr. Rogene Houston, MD Referred By:  Dr. Glo Herring, MD Procedure Date: 09/07/2014  Procedure:   Colonoscopy  Indications:  Patient is 74 year old Caucasian female was undergoing screening colonoscopy. Family history significant for multiple second-degree relatives with CRC. Family history is also positive for various non-GI malignancies. Last exam was in April 2011.  Informed Consent:  The procedure and risks were reviewed with the patient and informed consent was obtained.  Medications:  Demerol 50 mg IV Versed 5 mg IV  Description of procedure:  After a digital rectal exam was performed, that colonoscope was advanced from the anus through the rectum and colon to the area of the cecum, ileocecal valve and appendiceal orifice. The cecum was deeply intubated. These structures were well-seen and photographed for the record. From the level of the cecum and ileocecal valve, the scope was slowly and cautiously withdrawn. The mucosal surfaces were carefully surveyed utilizing scope tip to flexion to facilitate fold flattening as needed. The scope was pulled down into the rectum where a thorough exam including retroflexion was performed.  Findings:   Prep excellent. Scattered diverticula noted at sigmoid colon. Normal rectal mucosa except scar above the dentate line from previous hemorrhoidectomy. Small hemorrhoids below the dentate line.   Therapeutic/Diagnostic Maneuvers Performed:  None  Complications:  None  Cecal Withdrawal Time:  8 minutes  Impression:  Examination performed to cecum. Mild sigmoid colon diverticulosis. Small external hemorrhoids and scarring above the dentate line from previous hemorrhoidectomy.  Recommendations:  Standard instructions given. Next screening exam in 5 years as long as she remains in good health.  REHMAN,NAJEEB  U  09/07/2014 8:07 AM  CC: Dr. Glo Herring., MD & Dr. Rayne Du ref. provider found

## 2014-09-08 ENCOUNTER — Encounter (HOSPITAL_COMMUNITY): Payer: Self-pay | Admitting: Internal Medicine

## 2014-11-23 ENCOUNTER — Other Ambulatory Visit (HOSPITAL_COMMUNITY): Payer: Self-pay | Admitting: Internal Medicine

## 2014-11-23 DIAGNOSIS — Z1231 Encounter for screening mammogram for malignant neoplasm of breast: Secondary | ICD-10-CM

## 2015-01-18 ENCOUNTER — Ambulatory Visit (INDEPENDENT_AMBULATORY_CARE_PROVIDER_SITE_OTHER): Payer: PPO | Admitting: Orthopedic Surgery

## 2015-01-18 VITALS — BP 136/73 | Ht 60.0 in | Wt 129.0 lb

## 2015-01-18 DIAGNOSIS — M170 Bilateral primary osteoarthritis of knee: Secondary | ICD-10-CM | POA: Diagnosis not present

## 2015-01-18 NOTE — Progress Notes (Signed)
Patient requests bilateral knee injections for knee pain  Procedure note left knee injection verbal consent was obtained to inject left knee joint  Timeout was completed to confirm the site of injection  The medications used were 40 mg of Depo-Medrol and 1% lidocaine 3 cc  Anesthesia was provided by ethyl chloride and the skin was prepped with alcohol.  After cleaning the skin with alcohol a 20-gauge needle was used to inject the left knee joint. There were no complications. A sterile bandage was applied.   Procedure note right knee injection verbal consent was obtained to inject right knee joint  Timeout was completed to confirm the site of injection  The medications used were 40 mg of Depo-Medrol and 1% lidocaine 3 cc  Anesthesia was provided by ethyl chloride and the skin was prepped with alcohol.  After cleaning the skin with alcohol a 20-gauge needle was used to inject the right knee joint. There were no complications. A sterile bandage was applied.  RETURN AS NEEDED

## 2015-03-28 ENCOUNTER — Ambulatory Visit (HOSPITAL_COMMUNITY)
Admission: RE | Admit: 2015-03-28 | Discharge: 2015-03-28 | Disposition: A | Discharge status: Home / Self Care | Payer: PPO | Source: Ambulatory Visit | Attending: Internal Medicine | Admitting: Internal Medicine

## 2015-03-28 DIAGNOSIS — Z1231 Encounter for screening mammogram for malignant neoplasm of breast: Secondary | ICD-10-CM | POA: Diagnosis not present

## 2015-04-11 DIAGNOSIS — R3129 Other microscopic hematuria: Secondary | ICD-10-CM | POA: Diagnosis not present

## 2015-04-24 DIAGNOSIS — M170 Bilateral primary osteoarthritis of knee: Secondary | ICD-10-CM | POA: Diagnosis not present

## 2015-05-01 DIAGNOSIS — M170 Bilateral primary osteoarthritis of knee: Secondary | ICD-10-CM | POA: Diagnosis not present

## 2015-05-08 DIAGNOSIS — M170 Bilateral primary osteoarthritis of knee: Secondary | ICD-10-CM | POA: Diagnosis not present

## 2015-05-09 ENCOUNTER — Ambulatory Visit (INDEPENDENT_AMBULATORY_CARE_PROVIDER_SITE_OTHER): Payer: PPO | Admitting: Urology

## 2015-05-09 DIAGNOSIS — R3129 Other microscopic hematuria: Secondary | ICD-10-CM

## 2015-07-03 DIAGNOSIS — M170 Bilateral primary osteoarthritis of knee: Secondary | ICD-10-CM | POA: Diagnosis not present

## 2015-09-20 DIAGNOSIS — E663 Overweight: Secondary | ICD-10-CM | POA: Diagnosis not present

## 2015-09-20 DIAGNOSIS — E669 Obesity, unspecified: Secondary | ICD-10-CM | POA: Diagnosis not present

## 2015-09-20 DIAGNOSIS — Z6828 Body mass index (BMI) 28.0-28.9, adult: Secondary | ICD-10-CM | POA: Diagnosis not present

## 2015-09-20 DIAGNOSIS — I10 Essential (primary) hypertension: Secondary | ICD-10-CM | POA: Diagnosis not present

## 2015-09-20 DIAGNOSIS — Z713 Dietary counseling and surveillance: Secondary | ICD-10-CM | POA: Diagnosis not present

## 2015-10-17 ENCOUNTER — Ambulatory Visit (INDEPENDENT_AMBULATORY_CARE_PROVIDER_SITE_OTHER): Payer: PPO | Admitting: Urology

## 2015-10-17 DIAGNOSIS — R3129 Other microscopic hematuria: Secondary | ICD-10-CM

## 2015-10-22 DIAGNOSIS — E669 Obesity, unspecified: Secondary | ICD-10-CM | POA: Diagnosis not present

## 2015-10-22 DIAGNOSIS — E782 Mixed hyperlipidemia: Secondary | ICD-10-CM | POA: Diagnosis not present

## 2015-10-22 DIAGNOSIS — Z6828 Body mass index (BMI) 28.0-28.9, adult: Secondary | ICD-10-CM | POA: Diagnosis not present

## 2015-10-22 DIAGNOSIS — I10 Essential (primary) hypertension: Secondary | ICD-10-CM | POA: Diagnosis not present

## 2015-10-22 DIAGNOSIS — R600 Localized edema: Secondary | ICD-10-CM | POA: Diagnosis not present

## 2015-10-22 DIAGNOSIS — Z1389 Encounter for screening for other disorder: Secondary | ICD-10-CM | POA: Diagnosis not present

## 2015-10-30 DIAGNOSIS — E663 Overweight: Secondary | ICD-10-CM | POA: Diagnosis not present

## 2015-10-30 DIAGNOSIS — Z6828 Body mass index (BMI) 28.0-28.9, adult: Secondary | ICD-10-CM | POA: Diagnosis not present

## 2015-10-30 DIAGNOSIS — I10 Essential (primary) hypertension: Secondary | ICD-10-CM | POA: Diagnosis not present

## 2015-10-30 DIAGNOSIS — Z1389 Encounter for screening for other disorder: Secondary | ICD-10-CM | POA: Diagnosis not present

## 2015-10-30 DIAGNOSIS — T887XXA Unspecified adverse effect of drug or medicament, initial encounter: Secondary | ICD-10-CM | POA: Diagnosis not present

## 2015-10-30 DIAGNOSIS — R600 Localized edema: Secondary | ICD-10-CM | POA: Diagnosis not present

## 2015-10-31 DIAGNOSIS — H2513 Age-related nuclear cataract, bilateral: Secondary | ICD-10-CM | POA: Diagnosis not present

## 2015-10-31 DIAGNOSIS — H43812 Vitreous degeneration, left eye: Secondary | ICD-10-CM | POA: Diagnosis not present

## 2016-02-04 ENCOUNTER — Other Ambulatory Visit (HOSPITAL_COMMUNITY): Payer: Self-pay | Admitting: Internal Medicine

## 2016-02-04 DIAGNOSIS — Z1231 Encounter for screening mammogram for malignant neoplasm of breast: Secondary | ICD-10-CM

## 2016-02-12 DIAGNOSIS — E663 Overweight: Secondary | ICD-10-CM | POA: Diagnosis not present

## 2016-02-12 DIAGNOSIS — Z6828 Body mass index (BMI) 28.0-28.9, adult: Secondary | ICD-10-CM | POA: Diagnosis not present

## 2016-02-12 DIAGNOSIS — M1991 Primary osteoarthritis, unspecified site: Secondary | ICD-10-CM | POA: Diagnosis not present

## 2016-02-12 DIAGNOSIS — Z1389 Encounter for screening for other disorder: Secondary | ICD-10-CM | POA: Diagnosis not present

## 2016-02-12 DIAGNOSIS — M810 Age-related osteoporosis without current pathological fracture: Secondary | ICD-10-CM | POA: Diagnosis not present

## 2016-02-12 DIAGNOSIS — E782 Mixed hyperlipidemia: Secondary | ICD-10-CM | POA: Diagnosis not present

## 2016-02-12 DIAGNOSIS — I10 Essential (primary) hypertension: Secondary | ICD-10-CM | POA: Diagnosis not present

## 2016-02-12 DIAGNOSIS — Z0000 Encounter for general adult medical examination without abnormal findings: Secondary | ICD-10-CM | POA: Diagnosis not present

## 2016-03-31 ENCOUNTER — Ambulatory Visit (HOSPITAL_COMMUNITY)
Admission: RE | Admit: 2016-03-31 | Discharge: 2016-03-31 | Disposition: A | Discharge status: Home / Self Care | Payer: PPO | Source: Ambulatory Visit | Attending: Internal Medicine | Admitting: Internal Medicine

## 2016-03-31 DIAGNOSIS — Z1231 Encounter for screening mammogram for malignant neoplasm of breast: Secondary | ICD-10-CM

## 2016-04-29 DIAGNOSIS — M170 Bilateral primary osteoarthritis of knee: Secondary | ICD-10-CM | POA: Diagnosis not present

## 2016-05-06 DIAGNOSIS — M170 Bilateral primary osteoarthritis of knee: Secondary | ICD-10-CM | POA: Diagnosis not present

## 2016-05-07 ENCOUNTER — Other Ambulatory Visit (HOSPITAL_COMMUNITY): Payer: Self-pay | Admitting: Internal Medicine

## 2016-05-07 ENCOUNTER — Ambulatory Visit (HOSPITAL_COMMUNITY)
Admission: RE | Admit: 2016-05-07 | Discharge: 2016-05-07 | Disposition: A | Discharge status: Home / Self Care | Payer: PPO | Source: Ambulatory Visit | Attending: Internal Medicine | Admitting: Internal Medicine

## 2016-05-07 DIAGNOSIS — S299XXA Unspecified injury of thorax, initial encounter: Secondary | ICD-10-CM | POA: Diagnosis not present

## 2016-05-07 DIAGNOSIS — X58XXXA Exposure to other specified factors, initial encounter: Secondary | ICD-10-CM | POA: Insufficient documentation

## 2016-05-07 DIAGNOSIS — E663 Overweight: Secondary | ICD-10-CM | POA: Diagnosis not present

## 2016-05-07 DIAGNOSIS — Z1389 Encounter for screening for other disorder: Secondary | ICD-10-CM | POA: Diagnosis not present

## 2016-05-07 DIAGNOSIS — S29019A Strain of muscle and tendon of unspecified wall of thorax, initial encounter: Secondary | ICD-10-CM | POA: Diagnosis not present

## 2016-05-07 DIAGNOSIS — R52 Pain, unspecified: Secondary | ICD-10-CM

## 2016-05-07 DIAGNOSIS — Z6828 Body mass index (BMI) 28.0-28.9, adult: Secondary | ICD-10-CM | POA: Diagnosis not present

## 2016-05-07 DIAGNOSIS — S2020XA Contusion of thorax, unspecified, initial encounter: Secondary | ICD-10-CM | POA: Diagnosis not present

## 2016-05-07 DIAGNOSIS — R0781 Pleurodynia: Secondary | ICD-10-CM | POA: Diagnosis not present

## 2016-05-13 DIAGNOSIS — M170 Bilateral primary osteoarthritis of knee: Secondary | ICD-10-CM | POA: Diagnosis not present

## 2016-05-22 DIAGNOSIS — L820 Inflamed seborrheic keratosis: Secondary | ICD-10-CM | POA: Diagnosis not present

## 2016-05-22 DIAGNOSIS — X32XXXD Exposure to sunlight, subsequent encounter: Secondary | ICD-10-CM | POA: Diagnosis not present

## 2016-05-22 DIAGNOSIS — L821 Other seborrheic keratosis: Secondary | ICD-10-CM | POA: Diagnosis not present

## 2016-05-22 DIAGNOSIS — L570 Actinic keratosis: Secondary | ICD-10-CM | POA: Diagnosis not present

## 2016-11-18 DIAGNOSIS — H524 Presbyopia: Secondary | ICD-10-CM | POA: Diagnosis not present

## 2017-01-30 ENCOUNTER — Other Ambulatory Visit (HOSPITAL_COMMUNITY): Payer: Self-pay | Admitting: Internal Medicine

## 2017-01-30 DIAGNOSIS — Z1231 Encounter for screening mammogram for malignant neoplasm of breast: Secondary | ICD-10-CM

## 2017-02-10 DIAGNOSIS — M170 Bilateral primary osteoarthritis of knee: Secondary | ICD-10-CM | POA: Diagnosis not present

## 2017-02-17 DIAGNOSIS — M170 Bilateral primary osteoarthritis of knee: Secondary | ICD-10-CM | POA: Diagnosis not present

## 2017-02-24 DIAGNOSIS — M170 Bilateral primary osteoarthritis of knee: Secondary | ICD-10-CM | POA: Diagnosis not present

## 2017-02-25 DIAGNOSIS — Z1389 Encounter for screening for other disorder: Secondary | ICD-10-CM | POA: Diagnosis not present

## 2017-02-25 DIAGNOSIS — I10 Essential (primary) hypertension: Secondary | ICD-10-CM | POA: Diagnosis not present

## 2017-02-25 DIAGNOSIS — M1991 Primary osteoarthritis, unspecified site: Secondary | ICD-10-CM | POA: Diagnosis not present

## 2017-02-25 DIAGNOSIS — R7309 Other abnormal glucose: Secondary | ICD-10-CM | POA: Diagnosis not present

## 2017-02-25 DIAGNOSIS — Z6826 Body mass index (BMI) 26.0-26.9, adult: Secondary | ICD-10-CM | POA: Diagnosis not present

## 2017-02-25 DIAGNOSIS — E663 Overweight: Secondary | ICD-10-CM | POA: Diagnosis not present

## 2017-02-25 DIAGNOSIS — E782 Mixed hyperlipidemia: Secondary | ICD-10-CM | POA: Diagnosis not present

## 2017-02-25 DIAGNOSIS — Z0000 Encounter for general adult medical examination without abnormal findings: Secondary | ICD-10-CM | POA: Diagnosis not present

## 2017-02-25 DIAGNOSIS — E748 Other specified disorders of carbohydrate metabolism: Secondary | ICD-10-CM | POA: Diagnosis not present

## 2017-02-25 DIAGNOSIS — M810 Age-related osteoporosis without current pathological fracture: Secondary | ICD-10-CM | POA: Diagnosis not present

## 2017-02-27 ENCOUNTER — Other Ambulatory Visit (HOSPITAL_COMMUNITY): Payer: Self-pay | Admitting: Internal Medicine

## 2017-02-27 DIAGNOSIS — E2839 Other primary ovarian failure: Secondary | ICD-10-CM

## 2017-04-02 ENCOUNTER — Ambulatory Visit (HOSPITAL_COMMUNITY)
Admission: RE | Admit: 2017-04-02 | Discharge: 2017-04-02 | Disposition: A | Discharge status: Home / Self Care | Payer: PPO | Source: Ambulatory Visit | Attending: Internal Medicine | Admitting: Internal Medicine

## 2017-04-02 DIAGNOSIS — E2839 Other primary ovarian failure: Secondary | ICD-10-CM | POA: Diagnosis not present

## 2017-04-02 DIAGNOSIS — E663 Overweight: Secondary | ICD-10-CM | POA: Diagnosis not present

## 2017-04-02 DIAGNOSIS — Z1231 Encounter for screening mammogram for malignant neoplasm of breast: Secondary | ICD-10-CM | POA: Diagnosis not present

## 2017-04-02 DIAGNOSIS — Z780 Asymptomatic menopausal state: Secondary | ICD-10-CM | POA: Diagnosis not present

## 2017-04-02 DIAGNOSIS — M810 Age-related osteoporosis without current pathological fracture: Secondary | ICD-10-CM | POA: Diagnosis not present

## 2017-04-02 DIAGNOSIS — Z6826 Body mass index (BMI) 26.0-26.9, adult: Secondary | ICD-10-CM | POA: Insufficient documentation

## 2017-10-22 DIAGNOSIS — X32XXXD Exposure to sunlight, subsequent encounter: Secondary | ICD-10-CM | POA: Diagnosis not present

## 2017-10-22 DIAGNOSIS — L570 Actinic keratosis: Secondary | ICD-10-CM | POA: Diagnosis not present

## 2017-10-22 DIAGNOSIS — L820 Inflamed seborrheic keratosis: Secondary | ICD-10-CM | POA: Diagnosis not present

## 2017-10-22 DIAGNOSIS — D225 Melanocytic nevi of trunk: Secondary | ICD-10-CM | POA: Diagnosis not present

## 2018-01-12 DIAGNOSIS — M170 Bilateral primary osteoarthritis of knee: Secondary | ICD-10-CM | POA: Diagnosis not present

## 2018-01-19 DIAGNOSIS — M170 Bilateral primary osteoarthritis of knee: Secondary | ICD-10-CM | POA: Diagnosis not present

## 2018-01-21 ENCOUNTER — Other Ambulatory Visit (HOSPITAL_COMMUNITY): Payer: Self-pay | Admitting: Internal Medicine

## 2018-01-21 DIAGNOSIS — Z1231 Encounter for screening mammogram for malignant neoplasm of breast: Secondary | ICD-10-CM

## 2018-01-26 DIAGNOSIS — M170 Bilateral primary osteoarthritis of knee: Secondary | ICD-10-CM | POA: Diagnosis not present

## 2018-01-30 ENCOUNTER — Encounter (HOSPITAL_COMMUNITY): Payer: Self-pay

## 2018-01-30 ENCOUNTER — Other Ambulatory Visit: Payer: Self-pay

## 2018-01-30 ENCOUNTER — Emergency Department (HOSPITAL_COMMUNITY)
Admission: EM | Admit: 2018-01-30 | Discharge: 2018-01-30 | Disposition: A | Discharge status: Home / Self Care | Payer: PPO | Attending: Emergency Medicine | Admitting: Emergency Medicine

## 2018-01-30 ENCOUNTER — Emergency Department (HOSPITAL_COMMUNITY): Payer: PPO

## 2018-01-30 DIAGNOSIS — N390 Urinary tract infection, site not specified: Secondary | ICD-10-CM | POA: Insufficient documentation

## 2018-01-30 DIAGNOSIS — Z79899 Other long term (current) drug therapy: Secondary | ICD-10-CM | POA: Insufficient documentation

## 2018-01-30 DIAGNOSIS — Z7902 Long term (current) use of antithrombotics/antiplatelets: Secondary | ICD-10-CM | POA: Insufficient documentation

## 2018-01-30 DIAGNOSIS — K5792 Diverticulitis of intestine, part unspecified, without perforation or abscess without bleeding: Secondary | ICD-10-CM | POA: Diagnosis not present

## 2018-01-30 DIAGNOSIS — R109 Unspecified abdominal pain: Secondary | ICD-10-CM | POA: Diagnosis not present

## 2018-01-30 DIAGNOSIS — K5730 Diverticulosis of large intestine without perforation or abscess without bleeding: Secondary | ICD-10-CM | POA: Diagnosis not present

## 2018-01-30 DIAGNOSIS — Z91040 Latex allergy status: Secondary | ICD-10-CM | POA: Diagnosis not present

## 2018-01-30 DIAGNOSIS — R110 Nausea: Secondary | ICD-10-CM | POA: Diagnosis not present

## 2018-01-30 DIAGNOSIS — R350 Frequency of micturition: Secondary | ICD-10-CM | POA: Insufficient documentation

## 2018-01-30 DIAGNOSIS — I10 Essential (primary) hypertension: Secondary | ICD-10-CM | POA: Insufficient documentation

## 2018-01-30 DIAGNOSIS — R1030 Lower abdominal pain, unspecified: Secondary | ICD-10-CM | POA: Diagnosis present

## 2018-01-30 LAB — COMPREHENSIVE METABOLIC PANEL
ALT: 12 U/L (ref 0–44)
AST: 16 U/L (ref 15–41)
Albumin: 3.4 g/dL — ABNORMAL LOW (ref 3.5–5.0)
Alkaline Phosphatase: 114 U/L (ref 38–126)
Anion gap: 9 (ref 5–15)
BUN: 14 mg/dL (ref 8–23)
CO2: 24 mmol/L (ref 22–32)
CREATININE: 0.74 mg/dL (ref 0.44–1.00)
Calcium: 8.7 mg/dL — ABNORMAL LOW (ref 8.9–10.3)
Chloride: 108 mmol/L (ref 98–111)
GFR calc Af Amer: >60 mL/min (ref >60)
GFR calc non Af Amer: >60 mL/min (ref >60)
Glucose, Bld: 150 mg/dL — ABNORMAL HIGH (ref 70–99)
Potassium: 3.2 mmol/L — ABNORMAL LOW (ref 3.5–5.1)
Sodium: 141 mmol/L (ref 135–145)
Total Bilirubin: 0.9 mg/dL (ref 0.3–1.2)
Total Protein: 7.2 g/dL (ref 6.5–8.1)

## 2018-01-30 LAB — URINALYSIS, ROUTINE W REFLEX MICROSCOPIC
BILIRUBIN URINE: NEGATIVE
Glucose, UA: NEGATIVE mg/dL
KETONES UR: NEGATIVE mg/dL
Nitrite: POSITIVE — AB
PH: 5 (ref 5.0–8.0)
Protein, ur: NEGATIVE mg/dL
SPECIFIC GRAVITY, URINE: 1.018 (ref 1.005–1.030)

## 2018-01-30 LAB — CBC WITH DIFFERENTIAL/PLATELET
Abs Immature Granulocytes: 0.04 10*3/uL (ref 0.00–0.07)
Basophils Absolute: 0 10*3/uL (ref 0.0–0.1)
Basophils Relative: 0 %
EOS ABS: 0 10*3/uL (ref 0.0–0.5)
EOS PCT: 0 %
HEMATOCRIT: 42.9 % (ref 36.0–46.0)
HEMOGLOBIN: 13.2 g/dL (ref 12.0–15.0)
Immature Granulocytes: 0 %
LYMPHS PCT: 8 %
Lymphs Abs: 1 10*3/uL (ref 0.7–4.0)
MCH: 27.3 pg (ref 26.0–34.0)
MCHC: 30.8 g/dL (ref 30.0–36.0)
MCV: 88.6 fL (ref 80.0–100.0)
MONOS PCT: 6 %
Monocytes Absolute: 0.7 10*3/uL (ref 0.1–1.0)
Neutro Abs: 10.1 10*3/uL — ABNORMAL HIGH (ref 1.7–7.7)
Neutrophils Relative %: 86 %
Platelets: 300 10*3/uL (ref 150–400)
RBC: 4.84 MIL/uL (ref 3.87–5.11)
RDW: 13.3 % (ref 11.5–15.5)
WBC: 11.9 10*3/uL — ABNORMAL HIGH (ref 4.0–10.5)
nRBC: 0 % (ref 0.0–0.2)

## 2018-01-30 LAB — LIPASE, BLOOD: Lipase: 27 U/L (ref 11–51)

## 2018-01-30 LAB — TROPONIN I: Troponin I: <0.03 ng/mL (ref <0.03)

## 2018-01-30 MED ORDER — POTASSIUM CHLORIDE CRYS ER 20 MEQ PO TBCR
40.0000 meq | EXTENDED_RELEASE_TABLET | Freq: Once | ORAL | Status: AC
  Administered 2018-01-30: 40 meq via ORAL
  Filled 2018-01-30: qty 2

## 2018-01-30 MED ORDER — MORPHINE SULFATE (PF) 2 MG/ML IV SOLN
1.0000 mg | INTRAVENOUS | Status: DC | PRN
  Filled 2018-01-30: qty 1

## 2018-01-30 MED ORDER — PROMETHAZINE HCL 12.5 MG PO TABS: 12.5000 mg | ORAL_TABLET | Freq: Three times a day (TID) | ORAL | 0 refills | Status: DC | PRN

## 2018-01-30 MED ORDER — CIPROFLOXACIN HCL 500 MG PO TABS: 500.0000 mg | ORAL_TABLET | Freq: Two times a day (BID) | ORAL | 0 refills | Status: AC

## 2018-01-30 MED ORDER — IOPAMIDOL (ISOVUE-300) INJECTION 61%
100.0000 mL | Freq: Once | INTRAVENOUS | Status: AC | PRN
  Administered 2018-01-30: 100 mL via INTRAVENOUS

## 2018-01-30 MED ORDER — ACETAMINOPHEN 325 MG PO TABS
650.0000 mg | ORAL_TABLET | Freq: Once | ORAL | Status: AC
  Administered 2018-01-30: 650 mg via ORAL
  Filled 2018-01-30: qty 2

## 2018-01-30 MED ORDER — METRONIDAZOLE 500 MG PO TABS: 500.0000 mg | ORAL_TABLET | Freq: Three times a day (TID) | ORAL | 0 refills | Status: AC

## 2018-01-30 MED ORDER — CIPROFLOXACIN HCL 250 MG PO TABS
500.0000 mg | ORAL_TABLET | Freq: Once | ORAL | Status: AC
  Administered 2018-01-30: 500 mg via ORAL
  Filled 2018-01-30: qty 2

## 2018-01-30 MED ORDER — ONDANSETRON HCL 4 MG/2ML IJ SOLN
4.0000 mg | INTRAMUSCULAR | Status: DC | PRN
  Administered 2018-01-30: 4 mg via INTRAVENOUS
  Filled 2018-01-30: qty 2

## 2018-01-30 MED ORDER — METRONIDAZOLE 500 MG PO TABS
500.0000 mg | ORAL_TABLET | Freq: Once | ORAL | Status: AC
  Administered 2018-01-30: 500 mg via ORAL
  Filled 2018-01-30: qty 1

## 2018-01-30 NOTE — Discharge Instructions (Addendum)
Take the prescriptions as directed.  Take over the counter tylenol, as directed on packaging, as needed for discomfort. Eat a liquid diet for the next few days, then advance to a bland diet then to your regular diet slowly as you can tolerate it.  Call your regular medical doctor Monday to schedule a follow up appointment in the next 2 to 3 days.  Return to the Emergency Department immediately if not improving (or even worsening) despite taking the medicines as prescribed, any black or bloody stool or vomit, if you develop a fever over "101," or for any other concerns.

## 2018-01-30 NOTE — ED Triage Notes (Signed)
Pt c/o pain in lower abd since yesterday.  Denies vomiting or diarrhea.  LBM was today.  Reports nausea.  PT took emetrol pta. Denies urinary symptoms.

## 2018-01-30 NOTE — ED Provider Notes (Signed)
[facility] EMERGENCY DEPARTMENT Provider Note   CSN: [id number] Arrival date & time: 01/30/18  0944     History   Chief Complaint Chief Complaint  Patient presents with  . Abdominal Pain    HPI Tracy Townsend is a 77 y.o. female.  HPI  Pt was seen at 1100.  Per pt, c/o gradual onset and persistence of constant lower abd "pain" for the past 2 days. Has been associated with urinary frequency, nausea and multiple intermittent episodes of diarrhea.  Describes the diarrhea as "like glue."  Denies vomiting, no fevers, no back pain, no rash, no CP/SOB, no black or blood in stools, no hematuria.      Past Medical History:  Diagnosis Date  . Hyperlipemia   . Hypertension     Patient Active Problem List   Diagnosis Date Noted  . KNEE, ARTHRITIS, DEGEN./OSTEO 12/14/2008  . DERANGEMENT MENISCUS 12/14/2008  . KNEE PAIN 12/14/2008    Past Surgical History:  Procedure Laterality Date  . ABDOMINAL HYSTERECTOMY    . APPENDECTOMY    . bladder surg    . CARDIAC CATHETERIZATION    . CHOLECYSTECTOMY    . COLONOSCOPY N/A 09/07/2014   Procedure: COLONOSCOPY;  Surgeon: Rogene Houston, MD;  Location: [facility] ENDO SUITE;  Service: Endoscopy;  Laterality: N/A;  730  . HEMORRHOID SURGERY    . KNEE SURGERY       OB History   None      Home Medications    Prior to Admission medications   Medication Sig Start Date End Date Taking? Authorizing Provider  amLODipine (NORVASC) 5 MG tablet Take 5 mg by mouth daily.    [provider]  fish oil-omega-3 fatty acids 1000 MG capsule Take 1 g by mouth 2 (two) times daily.    [provider]  ibuprofen (ADVIL,MOTRIN) 200 MG tablet Take 200 mg by mouth 2 (two) times daily as needed.    [provider]  losartan (COZAAR) 50 MG tablet Take 100 mg by mouth daily.     [provider]  Multiple Vitamins-Calcium (ONE-A-DAY WOMENS PO) Take 1 tablet by mouth daily.    [provider]  pravastatin (PRAVACHOL) 20  MG tablet Take 20 mg by mouth daily.    [provider]  vitamin B-12 (CYANOCOBALAMIN) 500 MCG tablet Take 250 mcg by mouth daily.     [provider]    Family History No family history on file.  Social History Social History   Tobacco Use  . Smoking status: Never Smoker  . Smokeless tobacco: Never Used  Substance Use Topics  . Alcohol use: No  . Drug use: No     Allergies   Morphine and related and Latex   Review of Systems Review of Systems ROS: Statement: All systems negative except as marked or noted in the HPI; Constitutional: Negative for fever and chills. ; ; Eyes: Negative for eye pain, redness and discharge. ; ; ENMT: Negative for ear pain, hoarseness, nasal congestion, sinus pressure and sore throat. ; ; Cardiovascular: Negative for chest pain, palpitations, diaphoresis, dyspnea and peripheral edema. ; ; Respiratory: Negative for cough, wheezing and stridor. ; ; Gastrointestinal: +abd pain, nausea, diarrhea. Negative for vomiting, blood in stool, hematemesis, jaundice and rectal bleeding. . ; ; Genitourinary: +urinary frequency. Negative for dysuria, flank pain and hematuria. ; ; Musculoskeletal: Negative for back pain and neck pain. Negative for swelling and trauma.; ; Skin: Negative for pruritus, rash, abrasions, blisters, bruising and  skin lesion.; ; Neuro: Negative for headache, lightheadedness and neck stiffness. Negative for weakness, altered level of consciousness, altered mental status, extremity weakness, paresthesias, involuntary movement, seizure and syncope.       Physical Exam Updated Vital Signs BP 138/74 (BP Location: Left Arm)   Pulse 69   Temp 98.1 F (36.7 C) (Oral)   Resp 20   Ht 5\' 2"  (1.575 m)   Wt 56.7 kg   SpO2 100%   BMI 22.86 kg/m    Patient Vitals for the past 24 hrs:  BP Temp Temp src Pulse Resp SpO2 Height Weight  01/30/18 1502 (!) 143/73 98.3 F (36.8 C) Oral 79 18 97 % - -  01/30/18 1256 132/68 - - 70 18 98 % -  -  01/30/18 1102 138/74 - - 69 20 100 % - -  01/30/18 0952 (!) 158/92 98.1 F (36.7 C) Oral 84 20 97 % - -  01/30/18 0950 - - - - - - 5\' 2"  (1.575 m) 56.7 kg     Physical Exam 1105: Physical examination:  Nursing notes reviewed; Vital signs and O2 SAT reviewed;  Constitutional: Well developed, Well nourished, Well hydrated, In no acute distress; Head:  Normocephalic, atraumatic; Eyes: EOMI, PERRL, No scleral icterus; ENMT: Mouth and pharynx normal, Mucous membranes moist; Neck: Supple, Full range of motion, No lymphadenopathy; Cardiovascular: Regular rate and rhythm, No gallop; Respiratory: Breath sounds clear & equal bilaterally, No wheezes.  Speaking full sentences with ease, Normal respiratory effort/excursion; Chest: Nontender, Movement normal; Abdomen: Soft, +RLQ < suprapubic, LLQ tenderness to palp. No rebound or guarding. Nondistended, Normal bowel sounds; Genitourinary: No CVA tenderness; Extremities: Peripheral pulses normal, No tenderness, No edema, No calf edema or asymmetry.; Neuro: AA&Ox3, Major CN grossly intact.  Speech clear. No gross focal motor or sensory deficits in extremities.; Skin: Color normal, Warm, Dry.   ED Treatments / Results  Labs (all labs ordered are listed, but only abnormal results are displayed)   EKG EKG Interpretation  Date/Time:  Saturday January 30 2018 11:45:48 EST Ventricular Rate:  71 PR Interval:    QRS Duration: 92 QT Interval:  430 QTC Calculation: 468 R Axis:   -48 Text Interpretation:  Sinus rhythm Inferior infarct, old Anterior infarct, old Baseline wander When compared with ECG of 07/04/2012 No significant change was found Confirmed by Francine Graven [phone]) on 01/30/2018 12:10:00 PM   Radiology   Procedures Procedures (including critical care time)  Medications Ordered in ED Medications  morphine 2 MG/ML injection 1 mg (has no administration in time range)  ondansetron (ZOFRAN) injection 4 mg (4 mg Intravenous Given 01/30/18  1140)  potassium chloride SA (K-DUR,KLOR-CON) CR tablet 40 mEq (40 mEq Oral Given 01/30/18 1141)  acetaminophen (TYLENOL) tablet 650 mg (650 mg Oral Given 01/30/18 1233)     Initial Impression / Assessment and Plan / ED Course  I have reviewed the triage vital signs and the nursing notes.  Pertinent labs & imaging results that were available during my care of the patient were reviewed by me and considered in my medical decision making (see chart for details).  MDM Reviewed: previous chart, nursing note and vitals Reviewed previous: labs and ECG Interpretation: labs, x-ray, ECG and CT scan   Results for orders placed or performed during the hospital encounter of 01/30/18  Lipase, blood  Result Value Ref Range   Lipase 27 11 - 51 U/L  Comprehensive metabolic panel  Result Value Ref Range   Sodium 141 135 - 145  mmol/L   Potassium 3.2 (L) 3.5 - 5.1 mmol/L   Chloride 108 98 - 111 mmol/L   CO2 24 22 - 32 mmol/L   Glucose, Bld 150 (H) 70 - 99 mg/dL   BUN 14 8 - 23 mg/dL   Creatinine, Ser 0.74 0.44 - 1.00 mg/dL   Calcium 8.7 (L) 8.9 - 10.3 mg/dL   Total Protein 7.2 6.5 - 8.1 g/dL   Albumin 3.4 (L) 3.5 - 5.0 g/dL   AST 16 15 - 41 U/L   ALT 12 0 - 44 U/L   Alkaline Phosphatase 114 38 - 126 U/L   Total Bilirubin 0.9 0.3 - 1.2 mg/dL   GFR calc non Af Amer >60 >60 mL/min   GFR calc Af Amer >60 >60 mL/min   Anion gap 9 5 - 15  Urinalysis, Routine w reflex microscopic  Result Value Ref Range   Color, Urine YELLOW YELLOW   APPearance HAZY (A) CLEAR   Specific Gravity, Urine 1.018 1.005 - 1.030   pH 5.0 5.0 - 8.0   Glucose, UA NEGATIVE NEGATIVE mg/dL   Hgb urine dipstick MODERATE (A) NEGATIVE   Bilirubin Urine NEGATIVE NEGATIVE   Ketones, ur NEGATIVE NEGATIVE mg/dL   Protein, ur NEGATIVE NEGATIVE mg/dL   Nitrite POSITIVE (A) NEGATIVE   Leukocytes, UA MODERATE (A) NEGATIVE   RBC / HPF 0-5 0 - 5 RBC/hpf   WBC, UA >50 (H) 0 - 5 WBC/hpf   Bacteria, UA MANY (A) NONE SEEN   Squamous  Epithelial / LPF 0-5 0 - 5   WBC Clumps PRESENT    Mucus PRESENT   CBC with Differential  Result Value Ref Range   WBC 11.9 (H) 4.0 - 10.5 K/uL   RBC 4.84 3.87 - 5.11 MIL/uL   Hemoglobin 13.2 12.0 - 15.0 g/dL   HCT 42.9 36.0 - 46.0 %   MCV 88.6 80.0 - 100.0 fL   MCH 27.3 26.0 - 34.0 pg   MCHC 30.8 30.0 - 36.0 g/dL   RDW 13.3 11.5 - 15.5 %   Platelets 300 150 - 400 K/uL   nRBC 0.0 0.0 - 0.2 %   Neutrophils Relative % 86 %   Neutro Abs 10.1 (H) 1.7 - 7.7 K/uL   Lymphocytes Relative 8 %   Lymphs Abs 1.0 0.7 - 4.0 K/uL   Monocytes Relative 6 %   Monocytes Absolute 0.7 0.1 - 1.0 K/uL   Eosinophils Relative 0 %   Eosinophils Absolute 0.0 0.0 - 0.5 K/uL   Basophils Relative 0 %   Basophils Absolute 0.0 0.0 - 0.1 K/uL   Immature Granulocytes 0 %   Abs Immature Granulocytes 0.04 0.00 - 0.07 K/uL  Troponin I Once  Result Value Ref Range   Troponin I <0.03 <0.03 ng/mL   Dg Chest 2 View Result Date: 01/30/2018 CLINICAL DATA:  Abdominal pain for the last several weeks. EXAM: CHEST - 2 VIEW COMPARISON:  05/07/2016 FINDINGS: Artifact overlies the chest. Heart size is normal. There is aortic atherosclerosis. The pulmonary vascularity is normal. Lungs are clear. No effusions. No significant bone finding. No active disease. Aortic atherosclerosis. IMPRESSION: No active cardiopulmonary disease. Electronically Signed   By: Nelson Chimes M.D.   On: 01/30/2018 14:10   Ct Abdomen Pelvis W Contrast Result Date: 01/30/2018 CLINICAL DATA:  Pt c/o pain in lower abd since yesterday. Denies vomiting or diarrhea. LBM was today. Reports nausea. PT took emetrol pta. Denies urinary symptoms.Hx of HTN,cholecystectomy, appendectomy, hysterectomy EXAM: CT ABDOMEN AND  PELVIS WITH CONTRAST TECHNIQUE: Multidetector CT imaging of the abdomen and pelvis was performed using the standard protocol following bolus administration of intravenous contrast. CONTRAST:  192mL ISOVUE-300 IOPAMIDOL (ISOVUE-300) INJECTION 61%  COMPARISON:  10/31/2005 FINDINGS: Lower chest: No acute abnormality. Hepatobiliary: Liver is unremarkable. Common bile duct is dilated to 14 mm proximally with distal tapering. This is similar to the prior CT. Gallbladder surgically absent. Pancreas: Unremarkable. No pancreatic ductal dilatation or surrounding inflammatory changes. Spleen: Normal in size without focal abnormality. Adrenals/Urinary Tract: No adrenal masses. Kidneys normal in position. Mild right renal cortical thinning. Two low-density left renal masses, largest exophytic from the lateral midpole measuring 19 mm, consistent with cysts. No other masses, no stones and no hydronephrosis. Ureters are normal in course and in caliber. Bladder is unremarkable. Stomach/Bowel: There is wall thickening with adjacent inflammation along the mid to distal sigmoid colon, consistent with diverticulitis. There are numerous sigmoid diverticula. There is no extraluminal air. There is no fluid collection to suggest an abscess. Remainder of the colon shows no additional wall thickening. There are additional uninflamed diverticula. Stomach and small bowel are unremarkable. Vascular/Lymphatic: Aortic atherosclerosis. No pathologically enlarged lymph nodes. Reproductive: Status post hysterectomy. No adnexal masses. Other: No abdominal wall hernia or abnormality. No abdominopelvic ascites. Musculoskeletal: Moderate compression fracture of L1 not evident on the prior CT, but most likely chronic. No other fractures. No osteoblastic or osteolytic lesions IMPRESSION: 1. Acute uncomplicated sigmoid diverticulitis. No extraluminal air. No abscess. 2. No other acute abnormality within the abdomen or pelvis. 3. Chronic findings including aortic atherosclerosis. Electronically Signed   By: Lajean Manes M.D.   On: 01/30/2018 13:59     1500:  Pt did not want IV pain meds. APAP and potassium given PO. Pt has tol PO well while in the ED without N/V.  No stooling while in the ED.   Pt has ambulated several times around the ED with steady gait. Abd benign, resps easy, VSS. Feels better and wants to go home now. Offered admission; pt declines and would prefer to go home and family agrees with pt's decision. Strict return precautions given; pt and family verb understanding. Will dose cipro/flagyl in ED and rx same. UC pending. Dx and testing d/w pt and family.  Questions answered.  Verb understanding, agreeable to d/c home with outpt f/u.      Final Clinical Impressions(s) / ED Diagnoses   Final diagnoses:  None    ED Discharge Orders    None       Francine Graven, DO 02/03/18 1610

## 2018-02-02 DIAGNOSIS — M170 Bilateral primary osteoarthritis of knee: Secondary | ICD-10-CM | POA: Diagnosis not present

## 2018-02-02 LAB — URINE CULTURE

## 2018-02-03 ENCOUNTER — Telehealth: Payer: Self-pay | Admitting: Emergency Medicine

## 2018-02-03 DIAGNOSIS — I10 Essential (primary) hypertension: Secondary | ICD-10-CM | POA: Diagnosis not present

## 2018-02-03 DIAGNOSIS — K5792 Diverticulitis of intestine, part unspecified, without perforation or abscess without bleeding: Secondary | ICD-10-CM | POA: Diagnosis not present

## 2018-02-03 DIAGNOSIS — E663 Overweight: Secondary | ICD-10-CM | POA: Diagnosis not present

## 2018-02-03 DIAGNOSIS — Z6826 Body mass index (BMI) 26.0-26.9, adult: Secondary | ICD-10-CM | POA: Diagnosis not present

## 2018-02-03 DIAGNOSIS — M1991 Primary osteoarthritis, unspecified site: Secondary | ICD-10-CM | POA: Diagnosis not present

## 2018-02-03 DIAGNOSIS — Z1389 Encounter for screening for other disorder: Secondary | ICD-10-CM | POA: Diagnosis not present

## 2018-02-03 NOTE — Telephone Encounter (Signed)
Post ED Visit - Positive Culture Follow-up  Culture report reviewed by antimicrobial stewardship pharmacist:  []  Elenor Quinones, Pharm.D. []  Heide Guile, Pharm.D., BCPS AQ-ID []  Parks Neptune, Pharm.D., BCPS []  Alycia Rossetti, Pharm.D., BCPS []  Hollins, Pharm.D., BCPS, AAHIVP []  Legrand Como, Pharm.D., BCPS, AAHIVP []  Salome Arnt, PharmD, BCPS []  Johnnette Gourd, PharmD, BCPS []  Hughes Better, PharmD, BCPS []  Leeroy Cha, PharmD [facility] PharmD  Positive urine culture Treated with ciprofloxacin, organism sensitive to the same and no further patient follow-up is required at this time.  Hazle Nordmann 02/03/2018, 8:51 AM

## 2018-02-26 DIAGNOSIS — E663 Overweight: Secondary | ICD-10-CM | POA: Diagnosis not present

## 2018-02-26 DIAGNOSIS — M1991 Primary osteoarthritis, unspecified site: Secondary | ICD-10-CM | POA: Diagnosis not present

## 2018-02-26 DIAGNOSIS — M810 Age-related osteoporosis without current pathological fracture: Secondary | ICD-10-CM | POA: Diagnosis not present

## 2018-02-26 DIAGNOSIS — E782 Mixed hyperlipidemia: Secondary | ICD-10-CM | POA: Diagnosis not present

## 2018-02-26 DIAGNOSIS — Z6826 Body mass index (BMI) 26.0-26.9, adult: Secondary | ICD-10-CM | POA: Diagnosis not present

## 2018-02-26 DIAGNOSIS — I10 Essential (primary) hypertension: Secondary | ICD-10-CM | POA: Diagnosis not present

## 2018-02-26 DIAGNOSIS — Z1389 Encounter for screening for other disorder: Secondary | ICD-10-CM | POA: Diagnosis not present

## 2018-02-26 DIAGNOSIS — Z0001 Encounter for general adult medical examination with abnormal findings: Secondary | ICD-10-CM | POA: Diagnosis not present

## 2018-02-26 DIAGNOSIS — E7849 Other hyperlipidemia: Secondary | ICD-10-CM | POA: Diagnosis not present

## 2018-02-26 DIAGNOSIS — E781 Pure hyperglyceridemia: Secondary | ICD-10-CM | POA: Diagnosis not present

## 2018-04-05 ENCOUNTER — Ambulatory Visit (HOSPITAL_COMMUNITY)
Admission: RE | Admit: 2018-04-05 | Discharge: 2018-04-05 | Disposition: A | Discharge status: Home / Self Care | Payer: PPO | Source: Ambulatory Visit | Attending: Internal Medicine | Admitting: Internal Medicine

## 2018-04-05 DIAGNOSIS — Z1231 Encounter for screening mammogram for malignant neoplasm of breast: Secondary | ICD-10-CM | POA: Insufficient documentation

## 2018-06-28 DIAGNOSIS — Z0001 Encounter for general adult medical examination with abnormal findings: Secondary | ICD-10-CM | POA: Diagnosis not present

## 2018-06-28 DIAGNOSIS — M1991 Primary osteoarthritis, unspecified site: Secondary | ICD-10-CM | POA: Diagnosis not present

## 2018-06-28 DIAGNOSIS — E781 Pure hyperglyceridemia: Secondary | ICD-10-CM | POA: Diagnosis not present

## 2018-06-28 DIAGNOSIS — Z1389 Encounter for screening for other disorder: Secondary | ICD-10-CM | POA: Diagnosis not present

## 2018-06-28 DIAGNOSIS — I10 Essential (primary) hypertension: Secondary | ICD-10-CM | POA: Diagnosis not present

## 2018-06-28 DIAGNOSIS — Z681 Body mass index (BMI) 19 or less, adult: Secondary | ICD-10-CM | POA: Diagnosis not present

## 2018-11-09 ENCOUNTER — Other Ambulatory Visit (HOSPITAL_COMMUNITY): Payer: Self-pay | Admitting: Internal Medicine

## 2018-11-09 DIAGNOSIS — Z1231 Encounter for screening mammogram for malignant neoplasm of breast: Secondary | ICD-10-CM

## 2018-11-30 DIAGNOSIS — M170 Bilateral primary osteoarthritis of knee: Secondary | ICD-10-CM | POA: Diagnosis not present

## 2018-12-07 DIAGNOSIS — M170 Bilateral primary osteoarthritis of knee: Secondary | ICD-10-CM | POA: Diagnosis not present

## 2018-12-14 DIAGNOSIS — M170 Bilateral primary osteoarthritis of knee: Secondary | ICD-10-CM | POA: Diagnosis not present

## 2019-02-28 DIAGNOSIS — Z6826 Body mass index (BMI) 26.0-26.9, adult: Secondary | ICD-10-CM | POA: Diagnosis not present

## 2019-02-28 DIAGNOSIS — I10 Essential (primary) hypertension: Secondary | ICD-10-CM | POA: Diagnosis not present

## 2019-02-28 DIAGNOSIS — M1991 Primary osteoarthritis, unspecified site: Secondary | ICD-10-CM | POA: Diagnosis not present

## 2019-02-28 DIAGNOSIS — E7849 Other hyperlipidemia: Secondary | ICD-10-CM | POA: Diagnosis not present

## 2019-02-28 DIAGNOSIS — Z79899 Other long term (current) drug therapy: Secondary | ICD-10-CM | POA: Diagnosis not present

## 2019-02-28 DIAGNOSIS — S46001A Unspecified injury of muscle(s) and tendon(s) of the rotator cuff of right shoulder, initial encounter: Secondary | ICD-10-CM | POA: Diagnosis not present

## 2019-02-28 DIAGNOSIS — M5412 Radiculopathy, cervical region: Secondary | ICD-10-CM | POA: Diagnosis not present

## 2019-02-28 DIAGNOSIS — E663 Overweight: Secondary | ICD-10-CM | POA: Diagnosis not present

## 2019-04-11 ENCOUNTER — Ambulatory Visit (HOSPITAL_COMMUNITY): Payer: PPO

## 2019-04-14 ENCOUNTER — Ambulatory Visit (HOSPITAL_COMMUNITY): Payer: PPO

## 2019-05-12 ENCOUNTER — Ambulatory Visit (HOSPITAL_COMMUNITY): Payer: PPO

## 2019-06-08 ENCOUNTER — Other Ambulatory Visit: Payer: Self-pay

## 2019-06-08 ENCOUNTER — Ambulatory Visit (HOSPITAL_COMMUNITY)
Admission: RE | Admit: 2019-06-08 | Discharge: 2019-06-08 | Disposition: A | Discharge status: Home / Self Care | Payer: PPO | Source: Ambulatory Visit | Attending: Internal Medicine | Admitting: Internal Medicine

## 2019-06-08 DIAGNOSIS — Z1231 Encounter for screening mammogram for malignant neoplasm of breast: Secondary | ICD-10-CM | POA: Diagnosis not present

## 2019-07-26 DIAGNOSIS — M170 Bilateral primary osteoarthritis of knee: Secondary | ICD-10-CM | POA: Diagnosis not present

## 2019-08-02 DIAGNOSIS — M170 Bilateral primary osteoarthritis of knee: Secondary | ICD-10-CM | POA: Diagnosis not present

## 2019-08-09 ENCOUNTER — Encounter (INDEPENDENT_AMBULATORY_CARE_PROVIDER_SITE_OTHER): Payer: Self-pay | Admitting: *Deleted

## 2019-08-09 DIAGNOSIS — M170 Bilateral primary osteoarthritis of knee: Secondary | ICD-10-CM | POA: Diagnosis not present

## 2019-08-17 DIAGNOSIS — Z6826 Body mass index (BMI) 26.0-26.9, adult: Secondary | ICD-10-CM | POA: Diagnosis not present

## 2019-08-17 DIAGNOSIS — Z1389 Encounter for screening for other disorder: Secondary | ICD-10-CM | POA: Diagnosis not present

## 2019-08-17 DIAGNOSIS — E7849 Other hyperlipidemia: Secondary | ICD-10-CM | POA: Diagnosis not present

## 2019-08-17 DIAGNOSIS — B078 Other viral warts: Secondary | ICD-10-CM | POA: Diagnosis not present

## 2019-08-17 DIAGNOSIS — E781 Pure hyperglyceridemia: Secondary | ICD-10-CM | POA: Diagnosis not present

## 2019-08-17 DIAGNOSIS — M810 Age-related osteoporosis without current pathological fracture: Secondary | ICD-10-CM | POA: Diagnosis not present

## 2019-08-17 DIAGNOSIS — E663 Overweight: Secondary | ICD-10-CM | POA: Diagnosis not present

## 2019-08-17 DIAGNOSIS — Z0001 Encounter for general adult medical examination with abnormal findings: Secondary | ICD-10-CM | POA: Diagnosis not present

## 2019-08-17 DIAGNOSIS — I10 Essential (primary) hypertension: Secondary | ICD-10-CM | POA: Diagnosis not present

## 2019-08-17 DIAGNOSIS — L820 Inflamed seborrheic keratosis: Secondary | ICD-10-CM | POA: Diagnosis not present

## 2019-08-17 DIAGNOSIS — D225 Melanocytic nevi of trunk: Secondary | ICD-10-CM | POA: Diagnosis not present

## 2019-08-18 ENCOUNTER — Other Ambulatory Visit (HOSPITAL_COMMUNITY): Payer: Self-pay | Admitting: Internal Medicine

## 2019-08-18 DIAGNOSIS — E2839 Other primary ovarian failure: Secondary | ICD-10-CM

## 2019-08-29 ENCOUNTER — Ambulatory Visit (HOSPITAL_COMMUNITY)
Admission: RE | Admit: 2019-08-29 | Discharge: 2019-08-29 | Disposition: A | Discharge status: Home / Self Care | Payer: PPO | Source: Ambulatory Visit | Attending: Internal Medicine | Admitting: Internal Medicine

## 2019-08-29 ENCOUNTER — Other Ambulatory Visit: Payer: Self-pay

## 2019-08-29 DIAGNOSIS — E2839 Other primary ovarian failure: Secondary | ICD-10-CM | POA: Diagnosis not present

## 2019-08-29 DIAGNOSIS — Z780 Asymptomatic menopausal state: Secondary | ICD-10-CM | POA: Diagnosis not present

## 2019-08-29 DIAGNOSIS — M810 Age-related osteoporosis without current pathological fracture: Secondary | ICD-10-CM | POA: Diagnosis not present

## 2019-10-11 ENCOUNTER — Telehealth: Payer: Self-pay | Admitting: Orthopedic Surgery

## 2019-10-11 NOTE — Telephone Encounter (Signed)
Tracy Townsend called asking about an appointment to see Dr. Aline Brochure for Tracy Townsend knees.  The last time she saw Dr. Aline Brochure was 2016.  She did finally tell me that she has been seeing Dr. Noemi Chapel for Tracy Townsend knees.  I told Tracy Townsend that she would have to get Dr Archie Endo office notes and any xrays, MRI etc and bring that in for Dr. Aline Brochure to review.  Once he has reviewed the information, he will tell us whether he will see the patient and when to schedule them.  She understands and will get this information  To our office to be reviewed.

## 2019-11-08 DIAGNOSIS — M170 Bilateral primary osteoarthritis of knee: Secondary | ICD-10-CM | POA: Diagnosis not present

## 2019-12-09 DIAGNOSIS — K5900 Constipation, unspecified: Secondary | ICD-10-CM | POA: Diagnosis not present

## 2019-12-09 DIAGNOSIS — I10 Essential (primary) hypertension: Secondary | ICD-10-CM | POA: Diagnosis not present

## 2019-12-09 DIAGNOSIS — Z6825 Body mass index (BMI) 25.0-25.9, adult: Secondary | ICD-10-CM | POA: Diagnosis not present

## 2019-12-09 DIAGNOSIS — E7849 Other hyperlipidemia: Secondary | ICD-10-CM | POA: Diagnosis not present

## 2020-01-03 IMAGING — CT CT ABD-PELV W/ CM
2 of 5 series · 16 of 46 positions shown, 18 images · IV contrast (iopamidol)
Comparison: 10/31/2005

CLINICAL DATA: Pt c/o pain in lower abd since yesterday. Denies
vomiting or diarrhea. LBM was today. Reports nausea. PT took emetrol
pta. Denies urinary symptoms.Hx of HTN,cholecystectomy,
appendectomy, hysterectomy

EXAM:
CT ABDOMEN AND PELVIS WITH CONTRAST
TECHNIQUE: Multidetector CT imaging of the abdomen and pelvis was performed
using the standard protocol following bolus administration of
intravenous contrast.
CONTRAST:  100mL JLNS1N-YCC IOPAMIDOL (JLNS1N-YCC) INJECTION 61%

[Series 2: axial st · axial · 0.66mm/px · z∈[+915,+1290]mm · 13 of 86 slices shown, 15 images]
[im 6/86  soft-tissue]
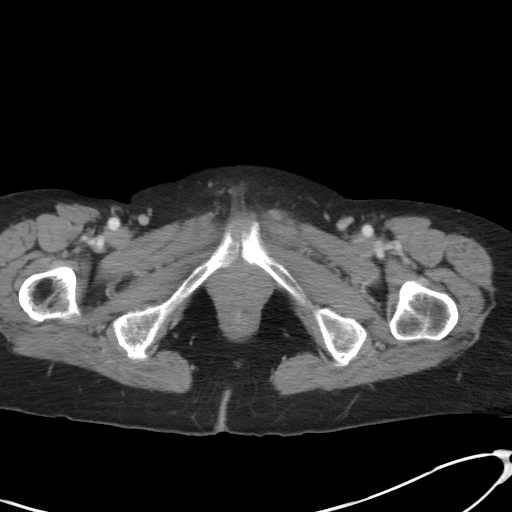
[im 6/86  bone]
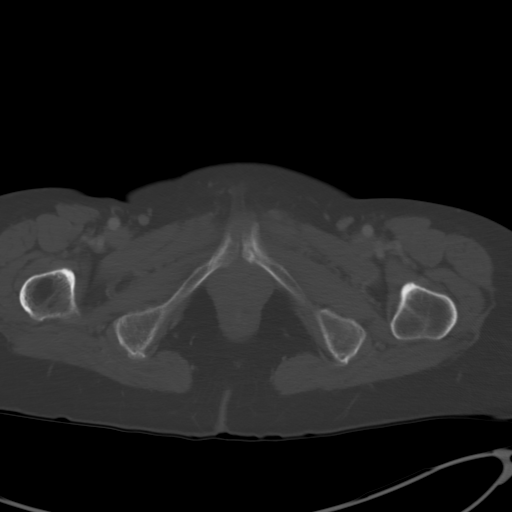
[im 11/86  soft-tissue]
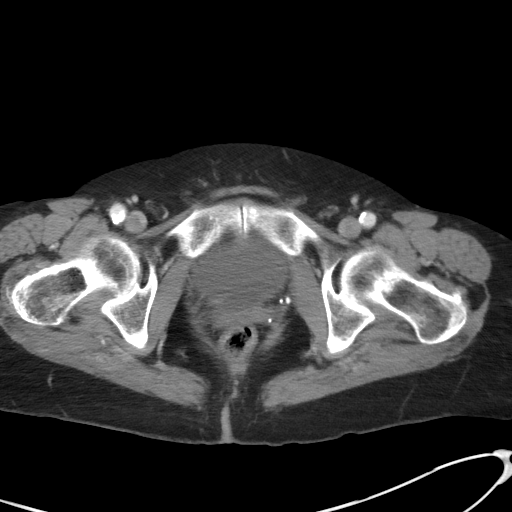
[im 21/86  soft-tissue]
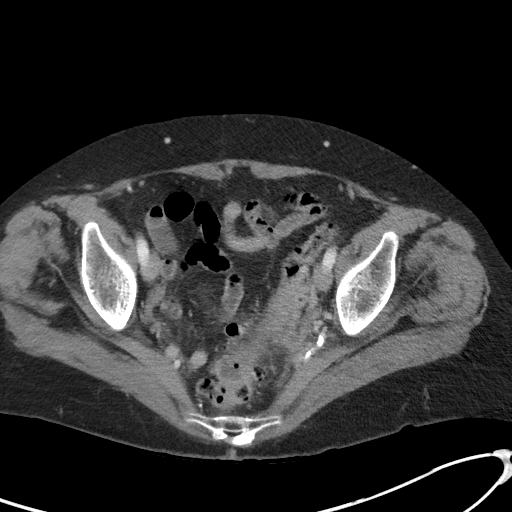
[im 26/86  soft-tissue]
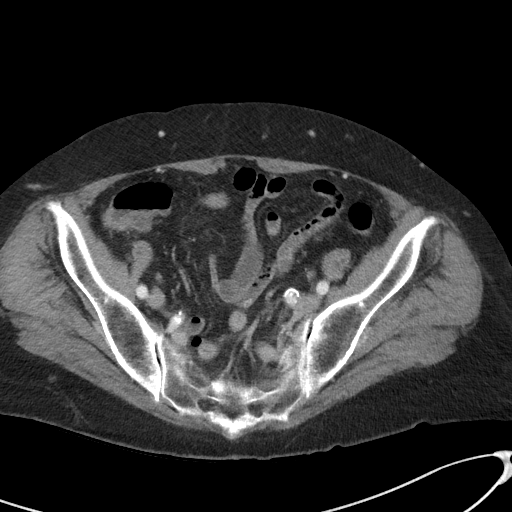
[im 31/86  soft-tissue]
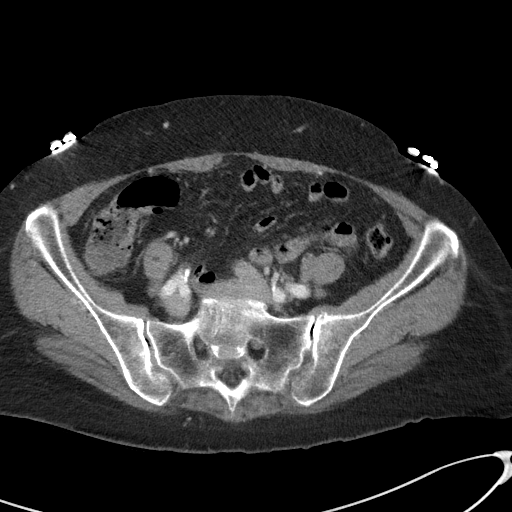
[im 36/86  soft-tissue]
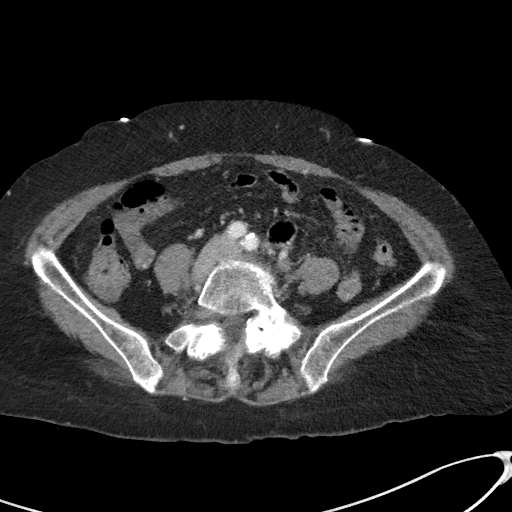
[im 46/86  soft-tissue]
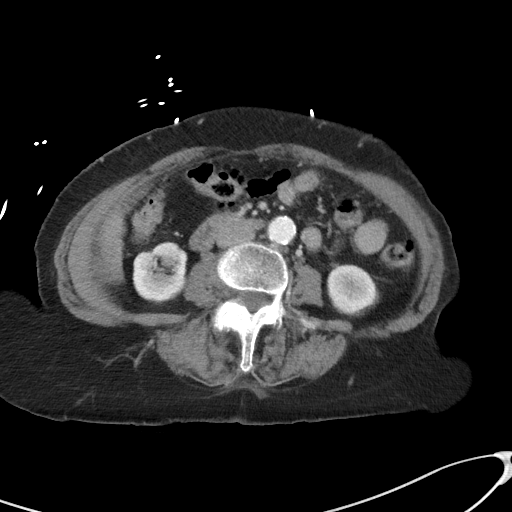
[im 51/86  soft-tissue]
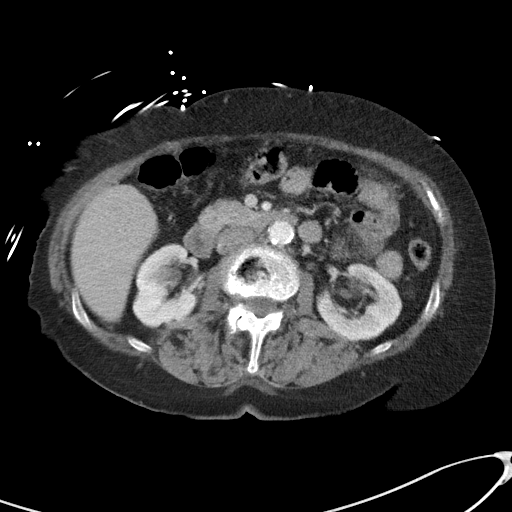
[im 56/86  soft-tissue]
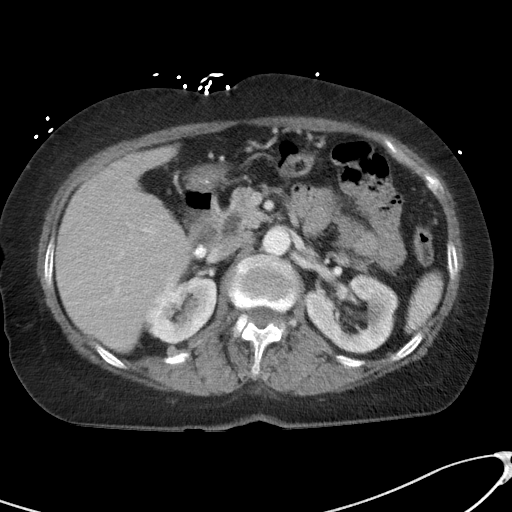
[im 56/86  bone]
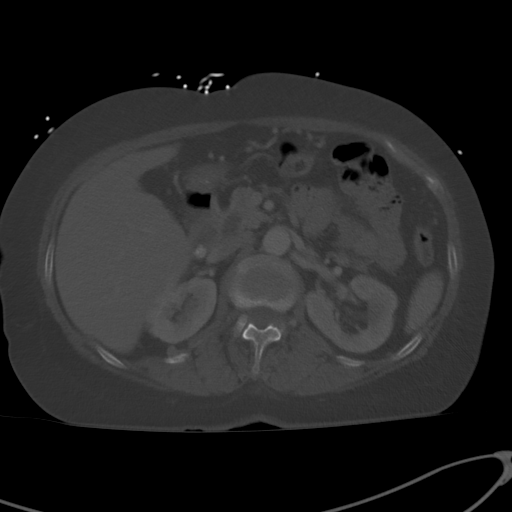
[im 61/86  soft-tissue]
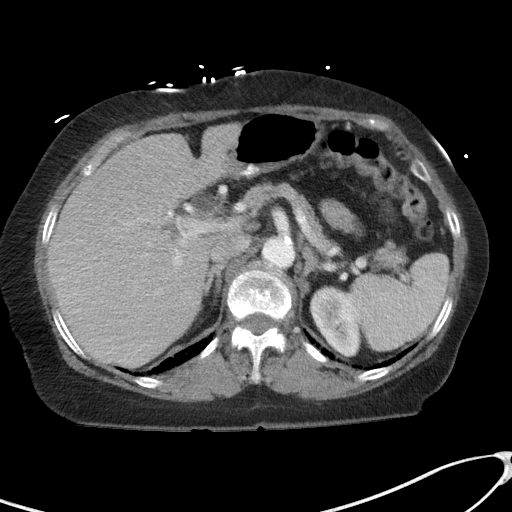
[im 66/86  soft-tissue]
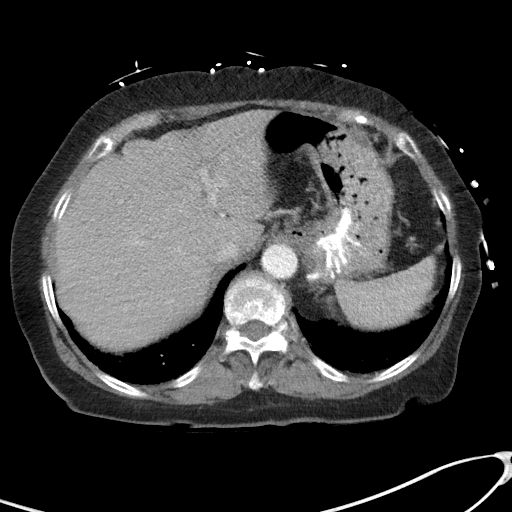
[im 76/86  soft-tissue]
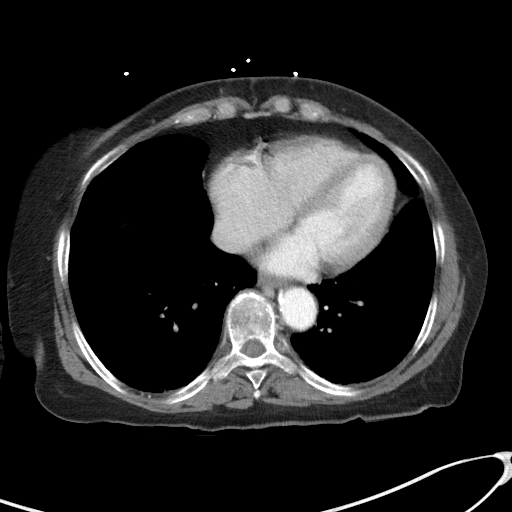
[im 81/86  soft-tissue]
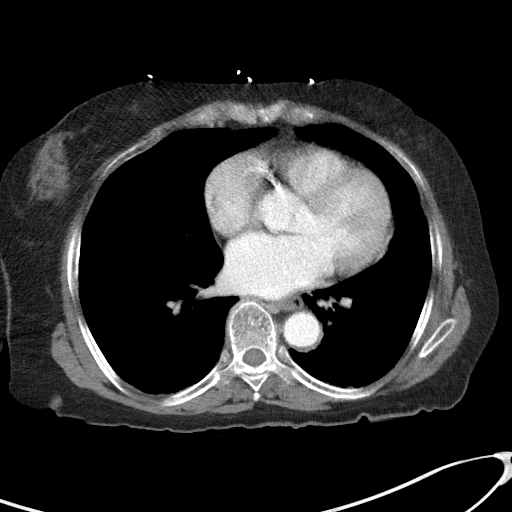

[Series 5: coronal st · coronal · 0.75mm/px · 3 of 83 slices shown]
[im 28/83  soft-tissue]
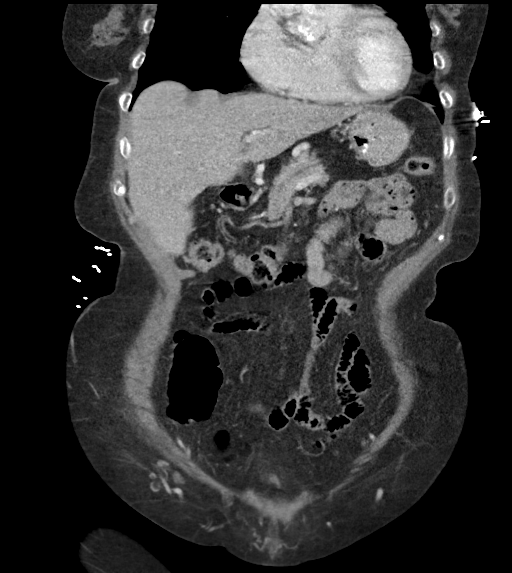
[im 37/83  soft-tissue]
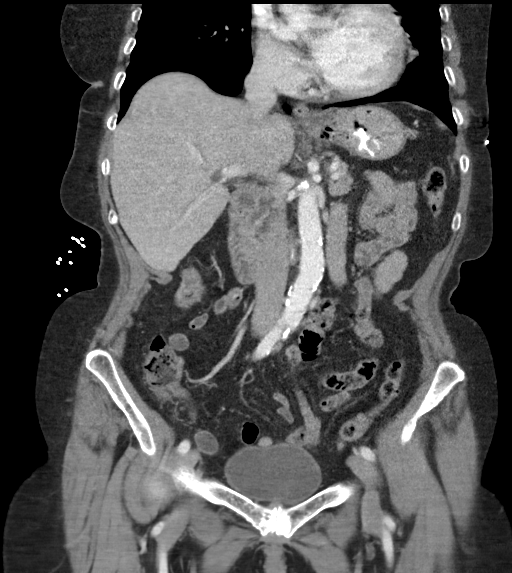
[im 46/83  soft-tissue]
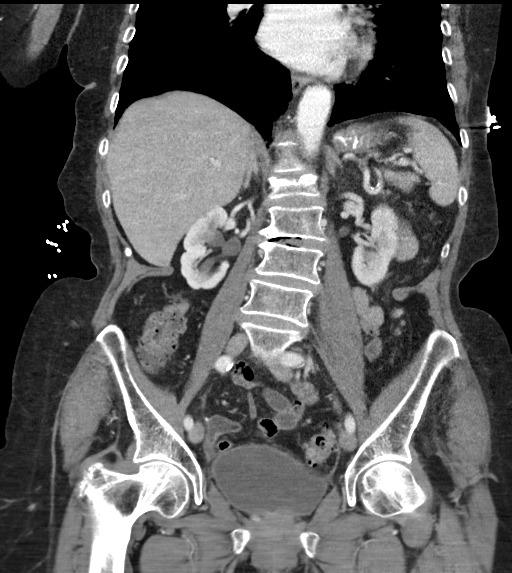

[16 of 46 positions shown; findings below may reference images not displayed]

FINDINGS: Lower chest: No acute abnormality.

Hepatobiliary: Liver is unremarkable. Common bile duct is dilated to
14 mm proximally with distal tapering. This is similar to the prior
CT. Gallbladder surgically absent.

Pancreas: Unremarkable. No pancreatic ductal dilatation or
surrounding inflammatory changes.

Spleen: Normal in size without focal abnormality.

Adrenals/Urinary Tract: No adrenal masses.

Kidneys normal in position. Mild right renal cortical thinning. Two
low-density left renal masses, largest exophytic from the lateral
midpole measuring 19 mm, consistent with cysts. No other masses, no
stones and no hydronephrosis. Ureters are normal in course and in
caliber. Bladder is unremarkable.

Stomach/Bowel: There is wall thickening with adjacent inflammation
along the mid to distal sigmoid colon, consistent with
diverticulitis. There are numerous sigmoid diverticula. There is no
extraluminal air. There is no fluid collection to suggest an
abscess. Remainder of the colon shows no additional wall thickening.
There are additional uninflamed diverticula. Stomach and small bowel
are unremarkable.

Vascular/Lymphatic: Aortic atherosclerosis. No pathologically
enlarged lymph nodes.

Reproductive: Status post hysterectomy. No adnexal masses.

Other: No abdominal wall hernia or abnormality. No abdominopelvic
ascites.

Musculoskeletal: Moderate compression fracture of L1 not evident on
the prior CT, but most likely chronic. No other fractures. No
osteoblastic or osteolytic lesions
IMPRESSION: 1. Acute uncomplicated sigmoid diverticulitis. No extraluminal air.
No abscess.
2. No other acute abnormality within the abdomen or pelvis.
3. Chronic findings including aortic atherosclerosis.

## 2020-01-05 ENCOUNTER — Encounter (INDEPENDENT_AMBULATORY_CARE_PROVIDER_SITE_OTHER): Payer: Self-pay | Admitting: *Deleted

## 2020-01-05 ENCOUNTER — Encounter (INDEPENDENT_AMBULATORY_CARE_PROVIDER_SITE_OTHER): Payer: Self-pay | Admitting: Gastroenterology

## 2020-01-05 ENCOUNTER — Telehealth (INDEPENDENT_AMBULATORY_CARE_PROVIDER_SITE_OTHER): Payer: Self-pay | Admitting: *Deleted

## 2020-01-05 ENCOUNTER — Other Ambulatory Visit: Payer: Self-pay

## 2020-01-05 ENCOUNTER — Other Ambulatory Visit (INDEPENDENT_AMBULATORY_CARE_PROVIDER_SITE_OTHER): Payer: Self-pay | Admitting: *Deleted

## 2020-01-05 ENCOUNTER — Ambulatory Visit (INDEPENDENT_AMBULATORY_CARE_PROVIDER_SITE_OTHER): Payer: PPO | Admitting: Gastroenterology

## 2020-01-05 DIAGNOSIS — R1030 Lower abdominal pain, unspecified: Secondary | ICD-10-CM

## 2020-01-05 DIAGNOSIS — R109 Unspecified abdominal pain: Secondary | ICD-10-CM | POA: Insufficient documentation

## 2020-01-05 DIAGNOSIS — K5900 Constipation, unspecified: Secondary | ICD-10-CM

## 2020-01-05 LAB — CBC WITH DIFFERENTIAL/PLATELET
Eosinophils Relative: 0.7 %
Hemoglobin: 14.4 g/dL (ref 11.7–15.5)
MPV: 10.1 fL (ref 7.5–12.5)
Monocytes Relative: 6.6 %
Neutrophils Relative %: 63.9 %
RBC: 5.16 10*6/uL — ABNORMAL HIGH (ref 3.80–5.10)
WBC: 7.6 10*3/uL (ref 3.8–10.8)

## 2020-01-05 MED ORDER — PLENVU 140 G PO SOLR: 1.0000 | Freq: Once | ORAL | 0 refills | Status: AC

## 2020-01-05 NOTE — Telephone Encounter (Signed)
Patient needs Plenvu (copay card) ° °

## 2020-01-05 NOTE — Progress Notes (Signed)
Maylon Peppers, M.D. Gastroenterology & Hepatology [facility] [address] [place], [place] [address] Primary Care Physician: Redmond School, MD [address] [place] [address]  Referring MD: PCP  I will communicate my assessment and recommendations to the referring MD via EMR. Note: Occasional unusual wording and randomly placed punctuation marks may result from the use of speech recognition technology to transcribe this document"  Chief Complaint: Constipation  History of Present Illness: Tracy Townsend is a 79 y.o. female with PMH HLD and HTN, who presents for evaluation of constipation.  Patient reports that she used to have 1 bowel movement every day in the past but noted that for the last 2 months she has had thicker and harder bowel movements during the past. She also noted having some lower abdominal pain before having a bowel movement, which improved after she defecated. States that her symptoms have actually worsened for the last 4 weeks as she has been having a bowel movement every third day. Due to this, her PCP gave her a prescription for milk of magnesia and ClearLax which she took for 10 days 1 cap every other day without any significant improvement. She only felt that she was feeling a little more bloated. Denies having any rectal bleeding or melena but she has had some clear discharge in stools recently. She denies taking any recent medications or painkillers before the onset of her symptoms but she has received 2 steroid shots in her knees recently. Also noticed that her appetite has decreased for the last 2 months but denies having any fullness or significant weight loss.  The patient denies having any nausea, vomiting, fever, chills, hematochezia, melena, hematemesis, abdominal distention, diarrhea, jaundice, pruritus.  Last EGD: Never Last Colonoscopy: 2014, states that she had polyps but no report is  available  FHx: neg for any gastrointestinal/liver disease, mother had melanoma, father had carcinoma of lung Social: neg smoking, alcohol or illicit drug use Surgical: Had hysterectomy, bladder cystopexy, hemorrhoids surgery and fissure surgery.  Past Medical History: Past Medical History:  Diagnosis Date  . Hyperlipemia   . Hypertension     Past Surgical History: Past Surgical History:  Procedure Laterality Date  . ABDOMINAL HYSTERECTOMY    . APPENDECTOMY    . bladder surg    . CARDIAC CATHETERIZATION    . CHOLECYSTECTOMY    . COLONOSCOPY N/A 09/07/2014   Procedure: COLONOSCOPY;  Surgeon: Rogene Houston, MD;  Location: [facility];  Service: Endoscopy;  Laterality: N/A;  730  . HEMORRHOID SURGERY    . KNEE SURGERY      Family History: Family History  Problem Relation Age of Onset  . Skin cancer Mother   . Skin cancer Father   . Prostate cancer Brother   . Healthy Brother     Social History: Social History   Tobacco Use  Smoking Status Never Smoker  Smokeless Tobacco Never Used   Social History   Substance and Sexual Activity  Alcohol Use No   Social History   Substance and Sexual Activity  Drug Use No    Allergies: Allergies  Allergen Reactions  . Morphine And Related Nausea And Vomiting  . Latex Rash    Medications: Current Outpatient Medications  Medication Sig Dispense Refill  . amLODipine (NORVASC) 5 MG tablet Take 5 mg by mouth daily.    . calcitonin, salmon, (MIACALCIN/FORTICAL) 200 UNIT/ACT nasal spray Place 1 spray into alternate nostrils daily.    Marland Kitchen  ibuprofen (ADVIL,MOTRIN) 200 MG tablet Take 200 mg by mouth 2 (two) times daily as needed.    Marland Kitchen losartan (COZAAR) 100 MG tablet Take 100 mg by mouth daily.     . vitamin B-12 (CYANOCOBALAMIN) 500 MCG tablet Take 250 mcg by mouth daily.     . promethazine (PHENERGAN) 12.5 MG tablet Take 1 tablet (12.5 mg total) by mouth every 8 (eight) hours as needed for nausea or vomiting. (Patient not  taking: Reported on 01/05/2020) 6 tablet 0   No current facility-administered medications for this visit.    Review of Systems: GENERAL: negative for malaise, night sweats HEENT: No changes in hearing or vision, no nose bleeds or other nasal problems. NECK: Negative for lumps, goiter, pain and significant neck swelling RESPIRATORY: Negative for cough, wheezing CARDIOVASCULAR: Negative for chest pain, leg swelling, palpitations, orthopnea GI: SEE HPI MUSCULOSKELETAL: Negative for joint pain or swelling, back pain, and muscle pain. SKIN: Negative for lesions, rash PSYCH: Negative for sleep disturbance, mood disorder and recent psychosocial stressors. HEMATOLOGY Negative for prolonged bleeding, bruising easily, and swollen nodes. ENDOCRINE: Negative for cold or heat intolerance, polyuria, polydipsia and goiter. NEURO: negative for tremor, gait imbalance, syncope and seizures. The remainder of the review of systems is noncontributory.   Physical Exam: BP (!) 141/85 (BP Location: Right Arm, Patient Position: Sitting, Cuff Size: Normal)   Pulse 71   Temp 98.3 F (36.8 C) (Oral)   Ht 5' (1.524 m)   Wt 127 lb 4.8 oz (57.7 kg)   BMI 24.86 kg/m  GENERAL: The patient is AO x3, in no acute distress. HEENT: Head is normocephalic and atraumatic. EOMI are intact. Mouth is well hydrated and without lesions. NECK: Supple. No masses LUNGS: Clear to auscultation. No presence of rhonchi/wheezing/rales. Adequate chest expansion HEART: RRR, normal s1 and s2. ABDOMEN: Soft, nontender, no guarding, no peritoneal signs, and nondistended. BS +. No masses. EXTREMITIES: Without any cyanosis, clubbing, rash, lesions or edema. NEUROLOGIC: AOx3, no focal motor deficit. SKIN: no jaundice, no rashes  Imaging/Labs: as above  I personally reviewed and interpreted the available labs, imaging and endoscopic files.  Impression and Plan: Tracy Townsend is a 79 y.o. female with PMH HLD and HTN, who presents  for evaluation of constipation. The patient has presented worsening constipation recently without presence of any red flag signs. When I asked her about her diet she reported that she actually had some bowel movements while taking prunes and did not have to strain significantly to do so. Due to this, I consider that it would be important for her to implement the daily intake of prunes before adding any other kind of laxative at this moment. We will check for hypothyroidism today as potential cause of constipation, while a CT scan of the abdomen and pelvis will also be performed as part of her evaluation for new onset constipation. She does not take any medication that could have potentially triggered her symptoms. Finally, we will proceed with a colonoscopy to evaluate the presence of any other etiology leading to her constipation. The patient understood and agreed.  - Take prunes daily - Check TSH - Schedule CT abdomen/pelvis with IV contrast - Schedule colonoscopy - RTC 4 months  All questions were answered.      Maylon Peppers, MD Gastroenterology and Hepatology [facility]

## 2020-01-05 NOTE — Patient Instructions (Signed)
Take prunes daily Perform blood workup Schedule CT abdomen/pelvis with IV contrast Schedule colonoscopy

## 2020-01-06 LAB — COMPREHENSIVE METABOLIC PANEL
AG Ratio: 1.1 (calc) (ref 1.0–2.5)
ALT: 11 U/L (ref 6–29)
AST: 14 U/L (ref 10–35)
Albumin: 3.7 g/dL (ref 3.6–5.1)
Alkaline phosphatase (APISO): 111 U/L (ref 37–153)
BUN: 16 mg/dL (ref 7–25)
CO2: 27 mmol/L (ref 20–32)
Calcium: 9.2 mg/dL (ref 8.6–10.4)
Chloride: 104 mmol/L (ref 98–110)
Creat: 0.88 mg/dL (ref 0.60–0.88)
Globulin: 3.4 g/dL (calc) (ref 1.9–3.7)
Glucose, Bld: 102 mg/dL (ref 65–139)
Potassium: 3.9 mmol/L (ref 3.5–5.3)
Sodium: 140 mmol/L (ref 135–146)
Total Bilirubin: 0.5 mg/dL (ref 0.2–1.2)
Total Protein: 7.1 g/dL (ref 6.1–8.1)

## 2020-01-06 LAB — CBC WITH DIFFERENTIAL/PLATELET
Absolute Monocytes: 502 cells/uL (ref 200–950)
Basophils Absolute: 38 cells/uL (ref 0–200)
Basophils Relative: 0.5 %
Eosinophils Absolute: 53 cells/uL (ref 15–500)
HCT: 44 % (ref 35.0–45.0)
Lymphs Abs: 2151 cells/uL (ref 850–3900)
MCH: 27.9 pg (ref 27.0–33.0)
MCHC: 32.7 g/dL (ref 32.0–36.0)
MCV: 85.3 fL (ref 80.0–100.0)
Neutro Abs: 4856 cells/uL (ref 1500–7800)
Platelets: 372 10*3/uL (ref 140–400)
RDW: 12.6 % (ref 11.0–15.0)
Total Lymphocyte: 28.3 %

## 2020-01-06 LAB — TSH: TSH: 1.52 mIU/L (ref 0.40–4.50)

## 2020-01-09 ENCOUNTER — Encounter (INDEPENDENT_AMBULATORY_CARE_PROVIDER_SITE_OTHER): Payer: Self-pay

## 2020-01-10 ENCOUNTER — Ambulatory Visit (HOSPITAL_COMMUNITY)
Admission: RE | Admit: 2020-01-10 | Discharge: 2020-01-10 | Disposition: A | Discharge status: Home / Self Care | Payer: PPO | Source: Ambulatory Visit | Attending: Gastroenterology | Admitting: Gastroenterology

## 2020-01-10 ENCOUNTER — Other Ambulatory Visit: Payer: Self-pay

## 2020-01-10 DIAGNOSIS — R1030 Lower abdominal pain, unspecified: Secondary | ICD-10-CM | POA: Diagnosis not present

## 2020-01-10 DIAGNOSIS — K5900 Constipation, unspecified: Secondary | ICD-10-CM | POA: Diagnosis not present

## 2020-01-10 DIAGNOSIS — R109 Unspecified abdominal pain: Secondary | ICD-10-CM | POA: Diagnosis not present

## 2020-01-10 MED ORDER — IOHEXOL 300 MG/ML  SOLN
100.0000 mL | Freq: Once | INTRAMUSCULAR | Status: AC | PRN
  Administered 2020-01-10: 77 mL via INTRAVENOUS

## 2020-01-27 ENCOUNTER — Other Ambulatory Visit (HOSPITAL_COMMUNITY): Payer: PPO

## 2020-02-07 NOTE — Patient Instructions (Signed)
Tracy Townsend  02/07/2020     @PREFPERIOPPHARMACY @   Your procedure is scheduled on  02/10/2020.  Report to [facility] at  1215  A.M.  Call this number if you have problems the morning of surgery:  [phone]   Remember:  Follow the diet and prep instructions given to you bu the office.                       Take these medicines the morning of surgery with A SIP OF WATER  None    Do not wear jewelry, make-up or nail polish.  Do not wear lotions, powders, or perfumes. Please wear deodorant and brush your teeth.  Do not shave 48 hours prior to surgery.  Men may shave face and neck.  Do not bring valuables to the hospital.  [facility] is not responsible for any belongings or valuables.  Contacts, dentures or bridgework may not be worn into surgery.  Leave your suitcase in the car.  After surgery it may be brought to your room.  For patients admitted to the hospital, discharge time will be determined by your treatment team.  Patients discharged the day of surgery will not be allowed to drive home.   Name and phone number of your driver:   family Special instructions:   DO NOT smoke the morning of your procedure.  Please read over the following fact sheets that you were given. Anesthesia Post-op Instructions and Care and Recovery After Surgery       Colonoscopy, Adult, Care After This sheet gives you information about how to care for yourself after your procedure. Your health care provider may also give you more specific instructions. If you have problems or questions, contact your health care provider. What can I expect after the procedure? After the procedure, it is common to have:  A small amount of blood in your stool for 24 hours after the procedure.  Some gas.  Mild cramping or bloating of your abdomen. Follow these instructions at home: Eating and drinking   Drink enough fluid to keep your urine pale yellow.  Follow instructions from your  health care provider about eating or drinking restrictions.  Resume your normal diet as instructed by your health care provider. Avoid heavy or fried foods that are hard to digest. Activity  Rest as told by your health care provider.  Avoid sitting for a long time without moving. Get up to take short walks every 1-2 hours. This is important to improve blood flow and breathing. Ask for help if you feel weak or unsteady.  Return to your normal activities as told by your health care provider. Ask your health care provider what activities are safe for you. Managing cramping and bloating   Try walking around when you have cramps or feel bloated.  Apply heat to your abdomen as told by your health care provider. Use the heat source that your health care provider recommends, such as a moist heat pack or a heating pad. ? Place a towel between your skin and the heat source. ? Leave the heat on for 20-30 minutes. ? Remove the heat if your skin turns bright red. This is especially important if you are unable to feel pain, heat, or cold. You may have a greater risk of getting burned. General instructions  For the first 24 hours after the procedure: ? Do not drive or use machinery. ? Do not sign  important documents. ? Do not drink alcohol. ? Do your regular daily activities at a slower pace than normal. ? Eat soft foods that are easy to digest.  Take over-the-counter and prescription medicines only as told by your health care provider.  Keep all follow-up visits as told by your health care provider. This is important. Contact a health care provider if:  You have blood in your stool 2-3 days after the procedure. Get help right away if you have:  More than a small spotting of blood in your stool.  Large blood clots in your stool.  Swelling of your abdomen.  Nausea or vomiting.  A fever.  Increasing pain in your abdomen that is not relieved with medicine. Summary  After the procedure,  it is common to have a small amount of blood in your stool. You may also have mild cramping and bloating of your abdomen.  For the first 24 hours after the procedure, do not drive or use machinery, sign important documents, or drink alcohol.  Get help right away if you have a lot of blood in your stool, nausea or vomiting, a fever, or increased pain in your abdomen. This information is not intended to replace advice given to you by your health care provider. Make sure you discuss any questions you have with your health care provider. Document Revised: 10/04/2018 Document Reviewed: 10/04/2018 Elsevier Patient Education  [place] After These instructions provide you with information about caring for yourself after your procedure. Your health care provider may also give you more specific instructions. Your treatment has been planned according to current medical practices, but problems sometimes occur. Call your health care provider if you have any problems or questions after your procedure. What can I expect after the procedure? After your procedure, you may:  Feel sleepy for several hours.  Feel clumsy and have poor balance for several hours.  Feel forgetful about what happened after the procedure.  Have poor judgment for several hours.  Feel nauseous or vomit.  Have a sore throat if you had a breathing tube during the procedure. Follow these instructions at home: For at least 24 hours after the procedure:      Have a responsible adult stay with you. It is important to have someone help care for you until you are awake and alert.  Rest as needed.  Do not: ? Participate in activities in which you could fall or become injured. ? Drive. ? Use heavy machinery. ? Drink alcohol. ? Take sleeping pills or medicines that cause drowsiness. ? Make important decisions or sign legal documents. ? Take care of children on your own. Eating and  drinking  Follow the diet that is recommended by your health care provider.  If you vomit, drink water, juice, or soup when you can drink without vomiting.  Make sure you have little or no nausea before eating solid foods. General instructions  Take over-the-counter and prescription medicines only as told by your health care provider.  If you have sleep apnea, surgery and certain medicines can increase your risk for breathing problems. Follow instructions from your health care provider about wearing your sleep device: ? Anytime you are sleeping, including during daytime naps. ? While taking prescription pain medicines, sleeping medicines, or medicines that make you drowsy.  If you smoke, do not smoke without supervision.  Keep all follow-up visits as told by your health care provider. This is important. Contact a health care provider if:  You keep feeling nauseous or you keep vomiting.  You feel light-headed.  You develop a rash.  You have a fever. Get help right away if:  You have trouble breathing. Summary  For several hours after your procedure, you may feel sleepy and have poor judgment.  Have a responsible adult stay with you for at least 24 hours or until you are awake and alert. This information is not intended to replace advice given to you by your health care provider. Make sure you discuss any questions you have with your health care provider. Document Revised: 06/08/2017 Document Reviewed: 07/01/2015 Elsevier Patient Education  [place].

## 2020-02-08 ENCOUNTER — Encounter (HOSPITAL_COMMUNITY)
Admission: RE | Admit: 2020-02-08 | Discharge: 2020-02-08 | Disposition: A | Discharge status: Home / Self Care | Payer: PPO | Source: Ambulatory Visit | Attending: Gastroenterology | Admitting: Gastroenterology

## 2020-02-08 ENCOUNTER — Other Ambulatory Visit: Payer: Self-pay

## 2020-02-08 ENCOUNTER — Other Ambulatory Visit (HOSPITAL_COMMUNITY)
Admission: RE | Admit: 2020-02-08 | Discharge: 2020-02-08 | Disposition: A | Discharge status: Home / Self Care | Payer: PPO | Source: Ambulatory Visit | Attending: Gastroenterology | Admitting: Gastroenterology

## 2020-02-08 DIAGNOSIS — Z20822 Contact with and (suspected) exposure to covid-19: Secondary | ICD-10-CM | POA: Insufficient documentation

## 2020-02-08 DIAGNOSIS — Z01812 Encounter for preprocedural laboratory examination: Secondary | ICD-10-CM | POA: Diagnosis not present

## 2020-02-08 LAB — SARS CORONAVIRUS 2 (TAT 6-24 HRS): SARS Coronavirus 2: NEGATIVE

## 2020-02-10 ENCOUNTER — Ambulatory Visit (HOSPITAL_COMMUNITY): Payer: PPO | Admitting: Anesthesiology

## 2020-02-10 ENCOUNTER — Ambulatory Visit (HOSPITAL_COMMUNITY)
Admission: RE | Admit: 2020-02-10 | Discharge: 2020-02-10 | Disposition: A | Discharge status: Home / Self Care | Payer: PPO | Attending: Gastroenterology | Admitting: Gastroenterology

## 2020-02-10 ENCOUNTER — Encounter (HOSPITAL_COMMUNITY)
Admission: RE | Disposition: A | Discharge status: Home / Self Care | Payer: Self-pay | Source: Home / Self Care | Attending: Gastroenterology

## 2020-02-10 ENCOUNTER — Encounter (HOSPITAL_COMMUNITY): Payer: Self-pay | Admitting: Gastroenterology

## 2020-02-10 DIAGNOSIS — D123 Benign neoplasm of transverse colon: Secondary | ICD-10-CM | POA: Insufficient documentation

## 2020-02-10 DIAGNOSIS — Z91040 Latex allergy status: Secondary | ICD-10-CM | POA: Diagnosis not present

## 2020-02-10 DIAGNOSIS — K5900 Constipation, unspecified: Secondary | ICD-10-CM | POA: Diagnosis not present

## 2020-02-10 DIAGNOSIS — Z800 Family history of malignant neoplasm of digestive organs: Secondary | ICD-10-CM | POA: Insufficient documentation

## 2020-02-10 DIAGNOSIS — K6389 Other specified diseases of intestine: Secondary | ICD-10-CM | POA: Diagnosis not present

## 2020-02-10 DIAGNOSIS — D120 Benign neoplasm of cecum: Secondary | ICD-10-CM | POA: Insufficient documentation

## 2020-02-10 DIAGNOSIS — Z885 Allergy status to narcotic agent status: Secondary | ICD-10-CM | POA: Insufficient documentation

## 2020-02-10 DIAGNOSIS — Z79899 Other long term (current) drug therapy: Secondary | ICD-10-CM | POA: Diagnosis not present

## 2020-02-10 DIAGNOSIS — D122 Benign neoplasm of ascending colon: Secondary | ICD-10-CM

## 2020-02-10 DIAGNOSIS — K648 Other hemorrhoids: Secondary | ICD-10-CM | POA: Insufficient documentation

## 2020-02-10 DIAGNOSIS — K5730 Diverticulosis of large intestine without perforation or abscess without bleeding: Secondary | ICD-10-CM | POA: Insufficient documentation

## 2020-02-10 DIAGNOSIS — K635 Polyp of colon: Secondary | ICD-10-CM | POA: Diagnosis not present

## 2020-02-10 HISTORY — PX: POLYPECTOMY: SHX5525

## 2020-02-10 HISTORY — PX: COLONOSCOPY WITH PROPOFOL: SHX5780

## 2020-02-10 SURGERY — COLONOSCOPY WITH PROPOFOL
Anesthesia: General

## 2020-02-10 MED ORDER — LACTATED RINGERS IV SOLN: INTRAVENOUS | Status: DC | PRN

## 2020-02-10 MED ORDER — LIDOCAINE HCL (CARDIAC) PF 100 MG/5ML IV SOSY
PREFILLED_SYRINGE | INTRAVENOUS | Status: DC | PRN
  Administered 2020-02-10: 50 mg via INTRAVENOUS

## 2020-02-10 MED ORDER — LACTATED RINGERS IV SOLN: Freq: Once | INTRAVENOUS | Status: AC

## 2020-02-10 MED ORDER — PROPOFOL 10 MG/ML IV BOLUS
INTRAVENOUS | Status: DC | PRN
  Administered 2020-02-10: 40 mg via INTRAVENOUS
  Administered 2020-02-10: 70 mg via INTRAVENOUS

## 2020-02-10 MED ORDER — PHENYLEPHRINE 40 MCG/ML (10ML) SYRINGE FOR IV PUSH (FOR BLOOD PRESSURE SUPPORT)
PREFILLED_SYRINGE | INTRAVENOUS | Status: DC | PRN
  Administered 2020-02-10: 40 ug via INTRAVENOUS
  Administered 2020-02-10: 120 ug via INTRAVENOUS

## 2020-02-10 MED ORDER — PROPOFOL 500 MG/50ML IV EMUL
INTRAVENOUS | Status: DC | PRN
  Administered 2020-02-10: 100 ug/kg/min via INTRAVENOUS

## 2020-02-10 NOTE — Op Note (Signed)
[facility] Patient Name: Tracy Townsend Procedure Date: 02/10/2020 12:22 PM MRN: [id number] Date of Birth: [birth year] Attending MD: Maylon Peppers ,  CSN: [id number] Age: 79 Admit Type: Outpatient Procedure:                Colonoscopy Indications:              Constipation Providers:                Maylon Peppers, Janeece Riggers, RN, Kristine L.                            Risa Grill, Technician Referring MD:              Medicines:                Monitored Anesthesia Care Complications:            No immediate complications. Estimated Blood Loss:     Estimated blood loss: none. Procedure:                Pre-Anesthesia Assessment:                           - Prior to the procedure, a History and Physical                            was performed, and patient medications, allergies                            and sensitivities were reviewed. The patient's                            tolerance of previous anesthesia was reviewed.                           - The risks and benefits of the procedure and the                            sedation options and risks were discussed with the                            patient. All questions were answered and informed                            consent was obtained.                           - ASA Grade Assessment: II - A patient with mild                            systemic disease.                           After obtaining informed consent, the colonoscope                            was passed under direct vision. Throughout the  procedure, the patient's blood pressure, pulse, and                            oxygen saturations were monitored continuously. The                            [id number]-HQ190L ([id number]) scope was introduced through                            the anus and advanced to the the cecum, identified                            by appendiceal orifice and ileocecal valve. The                             colonoscopy was performed without difficulty. The                            patient tolerated the procedure well. Scope In: 12:52:49 PM Scope Out: 1:18:52 PM Scope Withdrawal Time: 0 hours 19 minutes 36 seconds  Total Procedure Duration: 0 hours 26 minutes 3 seconds  Findings:      The perianal and digital rectal examinations were normal.      Two sessile polyps were found in the ascending colon. The polyps were 4       to 6 mm in size. These polyps were removed with a cold snare. Resection       and retrieval were complete.      Two sessile polyps were found in the transverse colon. The polyps were 3       to 6 mm in size. These polyps were removed with a cold snare. Resection       and retrieval were complete.      An area of congested mucosa was found in the sigmoid colon.      A few small and large-mouthed diverticula were found in the sigmoid       colon.      Non-bleeding internal hemorrhoids were found during retroflexion. The       hemorrhoids were small. Impression:               - Two 4 to 6 mm polyps in the ascending colon,                            removed with a cold snare. Resected and retrieved.                           - Two 3 to 6 mm polyps in the transverse colon,                            removed with a cold snare. Resected and retrieved.                           - Congested mucosa in the sigmoid colon.                           -  Diverticulosis in the sigmoid colon.                           - Non-bleeding internal hemorrhoids. Moderate Sedation:      Per Anesthesia Care Recommendation:           - Discharge patient to home (ambulatory).                           - High fiber diet.                           - Await pathology results.                           - Repeat colonoscopy is not recommended due to                            current age (82 years or older) for surveillance. Procedure Code(s):        --- Professional ---                            [phone], GC, Colonoscopy, flexible; with removal of                            tumor(s), polyp(s), or other lesion(s) by snare                            technique Diagnosis Code(s):        --- Professional ---                           K63.5, Polyp of colon                           K63.89, Other specified diseases of intestine                           K64.8, Other hemorrhoids                           K59.00, Constipation, unspecified CPT copyright 2019 American Medical Association. All rights reserved. The codes documented in this report are preliminary and upon coder review may  be revised to meet current compliance requirements. Maylon Peppers, MD Maylon Peppers,  02/10/2020 1:28:55 PM This report has been signed electronically. Number of Addenda: 0

## 2020-02-10 NOTE — Anesthesia Procedure Notes (Signed)
Date/Time: 02/10/2020 12:59 PM Performed by: Orlie Dakin, CRNA Pre-anesthesia Checklist: Patient identified, Emergency Drugs available, Suction available and Patient being monitored Patient Re-evaluated:Patient Re-evaluated prior to induction Oxygen Delivery Method: Nasal cannula Induction Type: IV induction Placement Confirmation: positive ETCO2

## 2020-02-10 NOTE — Anesthesia Postprocedure Evaluation (Signed)
Anesthesia Post Note  Patient: Tracy Townsend  Procedure(s) Performed: COLONOSCOPY WITH PROPOFOL (N/A ) POLYPECTOMY  Patient location during evaluation: PACU Anesthesia Type: General Level of consciousness: awake and alert and oriented Pain management: pain level controlled Vital Signs Assessment: post-procedure vital signs reviewed and stable Respiratory status: spontaneous breathing and respiratory function stable Cardiovascular status: blood pressure returned to baseline Postop Assessment: no apparent nausea or vomiting Anesthetic complications: no   No complications documented.   Last Vitals:  Vitals:   02/10/20 1330 02/10/20 1348  BP: 123/73 135/75  Pulse: 71 73  Resp: (!) 25 (!) 69  Temp:  (!) 36.4 C  SpO2: 98% 100%    Last Pain:  Vitals:   02/10/20 1348  TempSrc: Oral  PainSc: 0-No pain                 Herny Scurlock [address] [place]

## 2020-02-10 NOTE — Discharge Instructions (Signed)
You are being discharged to home.  Eat a high fiber diet.  We are waiting for your pathology results.  Your physician has indicated that a repeat colonoscopy is not recommended due to your current age (79 years or older) for surveillance.        Colonoscopy, Adult, Care After This sheet gives you information about how to care for yourself after your procedure. Your doctor may also give you more specific instructions. If you have problems or questions, call your doctor. What can I expect after the procedure? After the procedure, it is common to have:  A small amount of blood in your poop (stool) for 24 hours.  Some gas.  Mild cramping or bloating in your belly (abdomen). Follow these instructions at home: Eating and drinking   Drink enough fluid to keep your pee (urine) pale yellow.  Follow instructions from your doctor about what you cannot eat or drink.  Return to your normal diet as told by your doctor. Avoid heavy or fried foods that are hard to digest. Activity  Rest as told by your doctor.  Do not sit for a long time without moving. Get up to take short walks every 1-2 hours. This is important. Ask for help if you feel weak or unsteady.  Return to your normal activities as told by your doctor. Ask your doctor what activities are safe for you. To help cramping and bloating:   Try walking around.  Put heat on your belly as told by your doctor. Use the heat source that your doctor recommends, such as a moist heat pack or a heating pad. ? Put a towel between your skin and the heat source. ? Leave the heat on for 20-30 minutes. ? Remove the heat if your skin turns bright red. This is very important if you are unable to feel pain, heat, or cold. You may have a greater risk of getting burned. General instructions  For the first 24 hours after the procedure: ? Do not drive or use machinery. ? Do not sign important documents. ? Do not drink alcohol. ? Do your daily  activities more slowly than normal. ? Eat foods that are soft and easy to digest.  Take over-the-counter or prescription medicines only as told by your doctor.  Keep all follow-up visits as told by your doctor. This is important. Contact a doctor if:  You have blood in your poop 2-3 days after the procedure. Get help right away if:  You have more than a small amount of blood in your poop.  You see large clumps of tissue (blood clots) in your poop.  Your belly is swollen.  You feel like you may vomit (nauseous).  You vomit.  You have a fever.  You have belly pain that gets worse, and medicine does not help your pain. Summary  After the procedure, it is common to have a small amount of blood in your poop. You may also have mild cramping and bloating in your belly.  For the first 24 hours after the procedure, do not drive or use machinery, do not sign important documents, and do not drink alcohol.  Get help right away if you have a lot of blood in your poop, feel like you may vomit, have a fever, or have more belly pain. This information is not intended to replace advice given to you by your health care provider. Make sure you discuss any questions you have with your health care provider. Document Revised: 10/04/2018  Document Reviewed: 10/04/2018 Elsevier Patient Education  [place].     Colon Polyps  Polyps are tissue growths inside the body. Polyps can grow in many places, including the large intestine (colon). A polyp may be a round bump or a mushroom-shaped growth. You could have one polyp or several. Most colon polyps are noncancerous (benign). However, some colon polyps can become cancerous over time. Finding and removing the polyps early can help prevent this. What are the causes? The exact cause of colon polyps is not known. What increases the risk? You are more likely to develop this condition if you:  Have a family history of colon cancer or colon  polyps.  Are older than 80 or older than 45 if you are African American.  Have inflammatory bowel disease, such as ulcerative colitis or Crohn's disease.  Have certain hereditary conditions, such as: ? Familial adenomatous polyposis. ? Lynch syndrome. ? Turcot syndrome. ? Peutz-Jeghers syndrome.  Are overweight.  Smoke cigarettes.  Do not get enough exercise.  Drink too much alcohol.  Eat a diet that is high in fat and red meat and low in fiber.  Had childhood cancer that was treated with abdominal radiation. What are the signs or symptoms? Most polyps do not cause symptoms. If you have symptoms, they may include:  Blood coming from your rectum when having a bowel movement.  Blood in your stool. The stool may look dark red or black.  Abdominal pain.  A change in bowel habits, such as constipation or diarrhea. How is this diagnosed? This condition is diagnosed with a colonoscopy. This is a procedure in which a lighted, flexible scope is inserted into the anus and then passed into the colon to examine the area. Polyps are sometimes found when a colonoscopy is done as part of routine cancer screening tests. How is this treated? Treatment for this condition involves removing any polyps that are found. Most polyps can be removed during a colonoscopy. Those polyps will then be tested for cancer. Additional treatment may be needed depending on the results of testing. Follow these instructions at home: Lifestyle  Maintain a healthy weight, or lose weight if recommended by your health care provider.  Exercise every day or as told by your health care provider.  Do not use any products that contain nicotine or tobacco, such as cigarettes and e-cigarettes. If you need help quitting, ask your health care provider.  If you drink alcohol, limit how much you have: ? 0-1 drink a day for women. ? 0-2 drinks a day for men.  Be aware of how much alcohol is in your drink. In the [place]  one drink equals one 12 oz bottle of beer (355 mL), one 5 oz glass of wine (148 mL), or one 1 oz shot of hard liquor (44 mL). Eating and drinking   Eat foods that are high in fiber, such as fruits, vegetables, and whole grains.  Eat foods that are high in calcium and vitamin D, such as milk, cheese, yogurt, eggs, liver, fish, and broccoli.  Limit foods that are high in fat, such as fried foods and desserts.  Limit the amount of red meat and processed meat you eat, such as hot dogs, sausage, bacon, and lunch meats. General instructions  Keep all follow-up visits as told by your health care provider. This is important. ? This includes having regularly scheduled colonoscopies. ? Talk to your health care provider about when you need a colonoscopy. Contact a health care  provider if:  You have new or worsening bleeding during a bowel movement.  You have new or increased blood in your stool.  You have a change in bowel habits.  You lose weight for no known reason. Summary  Polyps are tissue growths inside the body. Polyps can grow in many places, including the colon.  Most colon polyps are noncancerous (benign), but some can become cancerous over time.  This condition is diagnosed with a colonoscopy.  Treatment for this condition involves removing any polyps that are found. Most polyps can be removed during a colonoscopy. This information is not intended to replace advice given to you by your health care provider. Make sure you discuss any questions you have with your health care provider. Document Revised: 06/25/2017 Document Reviewed: 06/25/2017 Elsevier Patient Education  [place].    High-Fiber Diet Fiber, also called dietary fiber, is a type of carbohydrate that is found in fruits, vegetables, whole grains, and beans. A high-fiber diet can have many health benefits. Your health care provider may recommend a high-fiber diet to help:  Prevent constipation. Fiber can  make your bowel movements more regular.  Lower your cholesterol.  Relieve the following conditions: ? Swelling of veins in the anus (hemorrhoids). ? Swelling and irritation (inflammation) of specific areas of the digestive tract (uncomplicated diverticulosis). ? A problem of the large intestine (colon) that sometimes causes pain and diarrhea (irritable bowel syndrome, IBS).  Prevent overeating as part of a weight-loss plan.  Prevent heart disease, type 2 diabetes, and certain cancers. What is my plan? The recommended daily fiber intake in grams (g) includes:  38 g for men age 17 or younger.  30 g for men over age 26.  71 g for women age 61 or younger.  21 g for women over age 63. You can get the recommended daily intake of dietary fiber by:  Eating a variety of fruits, vegetables, grains, and beans.  Taking a fiber supplement, if it is not possible to get enough fiber through your diet. What do I need to know about a high-fiber diet?  It is better to get fiber through food sources rather than from fiber supplements. There is not a lot of research about how effective supplements are.  Always check the fiber content on the nutrition facts label of any prepackaged food. Look for foods that contain 5 g of fiber or more per serving.  Talk with a diet and nutrition specialist (dietitian) if you have questions about specific foods that are recommended or not recommended for your medical condition, especially if those foods are not listed below.  Gradually increase how much fiber you consume. If you increase your intake of dietary fiber too quickly, you may have bloating, cramping, or gas.  Drink plenty of water. Water helps you to digest fiber. What are tips for following this plan?  Eat a wide variety of high-fiber foods.  Make sure that half of the grains that you eat each day are whole grains.  Eat breads and cereals that are made with whole-grain flour instead of refined  flour or white flour.  Eat brown rice, bulgur wheat, or millet instead of white rice.  Start the day with a breakfast that is high in fiber, such as a cereal that contains 5 g of fiber or more per serving.  Use beans in place of meat in soups, salads, and pasta dishes.  Eat high-fiber snacks, such as berries, raw vegetables, nuts, and popcorn.  Choose  whole fruits and vegetables instead of processed forms like juice or sauce. What foods can I eat?  Fruits Berries. Pears. Apples. Oranges. Avocado. Prunes and raisins. Dried figs. Vegetables Sweet potatoes. Spinach. Kale. Artichokes. Cabbage. Broccoli. Cauliflower. Green peas. Carrots. Squash. Grains Whole-grain breads. Multigrain cereal. Oats and oatmeal. Brown rice. Barley. Bulgur wheat. [place]. Quinoa. Bran muffins. Popcorn. Rye wafer crackers. Meats and other proteins Navy, kidney, and pinto beans. Soybeans. Split peas. Lentils. Nuts and seeds. Dairy Fiber-fortified yogurt. Beverages Fiber-fortified soy milk. Fiber-fortified orange juice. Other foods Fiber bars. The items listed above may not be a complete list of recommended foods and beverages. Contact a dietitian for more options. What foods are not recommended? Fruits Fruit juice. Cooked, strained fruit. Vegetables Fried potatoes. Canned vegetables. Well-cooked vegetables. Grains White bread. Pasta made with refined flour. White rice. Meats and other proteins Fatty cuts of meat. Fried chicken or fried fish. Dairy Milk. Yogurt. Cream cheese. Sour cream. Fats and oils Butters. Beverages Soft drinks. Other foods Cakes and pastries. The items listed above may not be a complete list of foods and beverages to avoid. Contact a dietitian for more information. Summary  Fiber is a type of carbohydrate. It is found in fruits, vegetables, whole grains, and beans.  There are many health benefits of eating a high-fiber diet, such as preventing constipation, lowering blood  cholesterol, helping with weight loss, and reducing your risk of heart disease, diabetes, and certain cancers.  Gradually increase your intake of fiber. Increasing too fast can result in cramping, bloating, and gas. Drink plenty of water while you increase your fiber.  The best sources of fiber include whole fruits and vegetables, whole grains, nuts, seeds, and beans. This information is not intended to replace advice given to you by your health care provider. Make sure you discuss any questions you have with your health care provider. Document Revised: 01/12/2017 Document Reviewed: 01/12/2017 Elsevier Patient Education  2020 [place] After These instructions provide you with information about caring for yourself after your procedure. Your health care provider may also give you more specific instructions. Your treatment has been planned according to current medical practices, but problems sometimes occur. Call your health care provider if you have any problems or questions after your procedure. What can I expect after the procedure? After your procedure, you may:  Feel sleepy for several hours.  Feel clumsy and have poor balance for several hours.  Feel forgetful about what happened after the procedure.  Have poor judgment for several hours.  Feel nauseous or vomit.  Have a sore throat if you had a breathing tube during the procedure. Follow these instructions at home: For at least 24 hours after the procedure:      Have a responsible adult stay with you. It is important to have someone help care for you until you are awake and alert.  Rest as needed.  Do not: ? Participate in activities in which you could fall or become injured. ? Drive. ? Use heavy machinery. ? Drink alcohol. ? Take sleeping pills or medicines that cause drowsiness. ? Make important decisions or sign legal documents. ? Take care of children on your own. Eating  and drinking  Follow the diet that is recommended by your health care provider.  If you vomit, drink water, juice, or soup when you can drink without vomiting.  Make sure you have little or no nausea before eating solid foods. General instructions  Take  over-the-counter and prescription medicines only as told by your health care provider.  If you have sleep apnea, surgery and certain medicines can increase your risk for breathing problems. Follow instructions from your health care provider about wearing your sleep device: ? Anytime you are sleeping, including during daytime naps. ? While taking prescription pain medicines, sleeping medicines, or medicines that make you drowsy.  If you smoke, do not smoke without supervision.  Keep all follow-up visits as told by your health care provider. This is important. Contact a health care provider if:  You keep feeling nauseous or you keep vomiting.  You feel light-headed.  You develop a rash.  You have a fever. Get help right away if:  You have trouble breathing. Summary  For several hours after your procedure, you may feel sleepy and have poor judgment.  Have a responsible adult stay with you for at least 24 hours or until you are awake and alert. This information is not intended to replace advice given to you by your health care provider. Make sure you discuss any questions you have with your health care provider. Document Revised: 06/08/2017 Document Reviewed: 07/01/2015 Elsevier Patient Education  [place].

## 2020-02-10 NOTE — Anesthesia Preprocedure Evaluation (Addendum)
Anesthesia Evaluation  Patient identified by MRN, date of birth, ID band Patient awake    Reviewed: Allergy & Precautions, NPO status , Patient's Chart, lab work & pertinent test results  History of Anesthesia Complications Negative for: history of anesthetic complications  Airway Mallampati: II  TM Distance: >3 FB Neck ROM: Full    Dental  (+) Dental Advisory Given, Partial Lower, Partial Upper   Pulmonary neg pulmonary ROS,    Pulmonary exam normal breath sounds clear to auscultation       Cardiovascular hypertension, Pt. on medications Normal cardiovascular exam Rhythm:Regular Rate:Normal  30-Jan-2018 11:45:48 [facility] ROUTINE RECORD Sinus rhythm Inferior infarct, old Anterior infarct, old Baseline wander When compared with ECG of 07/04/2012 No significant change was found   Neuro/Psych negative neurological ROS  negative psych ROS   GI/Hepatic negative GI ROS, Neg liver ROS,   Endo/Other  negative endocrine ROS  Renal/GU negative Renal ROS  negative genitourinary   Musculoskeletal  (+) Arthritis ,   Abdominal   Peds negative pediatric ROS (+)  Hematology negative hematology ROS (+)   Anesthesia Other Findings   Reproductive/Obstetrics negative OB ROS                            Anesthesia Physical Anesthesia Plan  ASA: II  Anesthesia Plan: General   Post-op Pain Management:    Induction: Intravenous  PONV Risk Score and Plan: TIVA  Airway Management Planned: Nasal Cannula and Natural Airway  Additional Equipment:   Intra-op Plan:   Post-operative Plan:   Informed Consent: I have reviewed the patients History and Physical, chart, labs and discussed the procedure including the risks, benefits and alternatives for the proposed anesthesia with the patient or authorized representative who has indicated his/her understanding and acceptance.      Dental advisory given  Plan Discussed with: CRNA and Surgeon  Anesthesia Plan Comments:        Anesthesia Quick Evaluation

## 2020-02-10 NOTE — H&P (Signed)
Tracy Townsend is an 79 y.o. female.   Chief Complaint: constipation HPI: 79 y.o. female with PMH HLD and HTN, who comes to the hospital for evaluation of constipation.  the patient has presented intermittent episodes of constipation for the last 2 months.  She initially tried taking some over-the-counter laxatives without improvement.  However recently she has tried taking prunes and that has improved her constipation.  Denies have any mental pain, nausea, vomiting, melena or hematochezia.  Her last colonoscopy was performed in 2014, reports having some polyps but no reports available.  No family history of colorectal cancer.   Past Medical History:  Diagnosis Date  . Hyperlipemia   . Hypertension     Past Surgical History:  Procedure Laterality Date  . ABDOMINAL HYSTERECTOMY    . APPENDECTOMY    . bladder surg    . CARDIAC CATHETERIZATION    . CHOLECYSTECTOMY    . COLONOSCOPY N/A 09/07/2014   Procedure: COLONOSCOPY;  Surgeon: Rogene Houston, MD;  Location: [facility];  Service: Endoscopy;  Laterality: N/A;  730  . HEMORRHOID SURGERY    . KNEE SURGERY      Family History  Problem Relation Age of Onset  . Skin cancer Mother   . Skin cancer Father   . Prostate cancer Brother   . Healthy Brother   . Colon cancer Maternal Grandmother    Social History:  reports that she has never smoked. She has never used smokeless tobacco. She reports that she does not drink alcohol and does not use drugs.  Allergies:  Allergies  Allergen Reactions  . Morphine And Related Nausea And Vomiting  . Latex Rash    Medications Prior to Admission  Medication Sig Dispense Refill  . calcitonin, salmon, (MIACALCIN/FORTICAL) 200 UNIT/ACT nasal spray Place 1 spray into alternate nostrils daily.    . cholecalciferol (VITAMIN D3) 25 MCG (1000 UNIT) tablet Take 1,000 Units by mouth daily.    Marland Kitchen losartan (COZAAR) 100 MG tablet Take 100 mg by mouth at bedtime.     . Multiple Vitamin (MULTIVITAMIN  WITH MINERALS) TABS tablet Take 1 tablet by mouth daily.    . pravastatin (PRAVACHOL) 20 MG tablet Take 20 mg by mouth at bedtime.    . vitamin B-12 (CYANOCOBALAMIN) 500 MCG tablet Take 500 mcg by mouth daily.       Results for orders placed or performed during the hospital encounter of 02/08/20 (from the past 48 hour(s))  SARS CORONAVIRUS 2 (TAT 6-24 HRS) Nasopharyngeal Nasopharyngeal Swab     Status: None   Collection Time: 02/08/20  1:25 PM   Specimen: Nasopharyngeal Swab  Result Value Ref Range   SARS Coronavirus 2 NEGATIVE NEGATIVE    Comment: (NOTE) SARS-CoV-2 target nucleic acids are NOT DETECTED.  The SARS-CoV-2 RNA is generally detectable in upper and lower respiratory specimens during the acute phase of infection. Negative results do not preclude SARS-CoV-2 infection, do not rule out co-infections with other pathogens, and should not be used as the sole basis for treatment or other patient management decisions. Negative results must be combined with clinical observations, patient history, and epidemiological information. The expected result is Negative.  Fact Sheet for Patients: SugarRoll.be  Fact Sheet for Healthcare Providers: https://www.woods-mathews.com/  This test is not yet approved or cleared by the [place] FDA and  has been authorized for detection and/or diagnosis of SARS-CoV-2 by FDA under an Emergency Use Authorization (EUA). This EUA will remain  in effect (meaning this test  can be used) for the duration of the COVID-19 declaration under Se ction 564(b)(1) of the Act, 21 U.S.C. section 360bbb-3(b)(1), unless the authorization is terminated or revoked sooner.  Performed at [facility], [place] [address], [place], [place] [address]    No results found.  Review of Systems  Constitutional: Negative.   HENT: Negative.   Eyes: Negative.   Respiratory: Negative.   Cardiovascular: Negative.    Gastrointestinal: Positive for constipation.  Endocrine: Negative.   Genitourinary: Negative.   Musculoskeletal: Negative.   Skin: Negative.   Allergic/Immunologic: Negative.   Neurological: Negative.   Hematological: Negative.   Psychiatric/Behavioral: Negative.     Blood pressure (!) 114/94, pulse 82, temperature 98.6 F (37 C), temperature source Oral, resp. rate 14, height 5' (1.524 m), weight 56.7 kg, SpO2 100 %. Physical Exam  GENERAL: The patient is AO x3, in no acute distress. HEENT: Head is normocephalic and atraumatic. EOMI are intact. Mouth is well hydrated and without lesions. NECK: Supple. No masses LUNGS: Clear to auscultation. No presence of rhonchi/wheezing/rales. Adequate chest expansion HEART: RRR, normal s1 and s2. ABDOMEN: Soft, nontender, no guarding, no peritoneal signs, and nondistended. BS +. No masses. EXTREMITIES: Without any cyanosis, clubbing, rash, lesions or edema. NEUROLOGIC: AOx3, no focal motor deficit. SKIN: no jaundice, no rashes   Assessment/Plan 79 y.o. female with PMH HLD and HTN, who comes to the hospital for evaluation of constipation. Will proceed with colonoscopy.  Harvel Quale, MD 02/10/2020, 12:43 PM

## 2020-02-10 NOTE — Transfer of Care (Signed)
Immediate Anesthesia Transfer of Care Note  Patient: Melvern Sample  Procedure(s) Performed: COLONOSCOPY WITH PROPOFOL (N/A ) POLYPECTOMY  Patient Location: PACU  Anesthesia Type:General  Level of Consciousness: awake and oriented  Airway & Oxygen Therapy: Patient Spontanous Breathing  Post-op Assessment: Report given to RN and Post -op Vital signs reviewed and stable  Post vital signs: Reviewed and stable  Last Vitals:  Vitals Value Taken Time  BP    Temp    Pulse 72 02/10/20 1324  Resp 15 02/10/20 1324  SpO2 98 % 02/10/20 1324  Vitals shown include unvalidated device data.  Last Pain:  Vitals:   02/10/20 1239  TempSrc: Oral  PainSc: 0-No pain      Patients Stated Pain Goal: 7 (33/61/22 [id number])  Complications: No complications documented.

## 2020-02-14 LAB — SURGICAL PATHOLOGY

## 2020-02-15 ENCOUNTER — Encounter (HOSPITAL_COMMUNITY): Payer: Self-pay | Admitting: Gastroenterology

## 2020-04-04 ENCOUNTER — Other Ambulatory Visit (HOSPITAL_COMMUNITY): Payer: Self-pay | Admitting: Internal Medicine

## 2020-04-04 DIAGNOSIS — Z1231 Encounter for screening mammogram for malignant neoplasm of breast: Secondary | ICD-10-CM

## 2020-04-19 DIAGNOSIS — L570 Actinic keratosis: Secondary | ICD-10-CM | POA: Diagnosis not present

## 2020-04-19 DIAGNOSIS — L820 Inflamed seborrheic keratosis: Secondary | ICD-10-CM | POA: Diagnosis not present

## 2020-04-19 DIAGNOSIS — X32XXXD Exposure to sunlight, subsequent encounter: Secondary | ICD-10-CM | POA: Diagnosis not present

## 2020-05-01 DIAGNOSIS — M170 Bilateral primary osteoarthritis of knee: Secondary | ICD-10-CM | POA: Diagnosis not present

## 2020-05-08 DIAGNOSIS — M170 Bilateral primary osteoarthritis of knee: Secondary | ICD-10-CM | POA: Diagnosis not present

## 2020-05-15 DIAGNOSIS — M170 Bilateral primary osteoarthritis of knee: Secondary | ICD-10-CM | POA: Diagnosis not present

## 2020-06-11 ENCOUNTER — Ambulatory Visit (HOSPITAL_COMMUNITY)
Admission: RE | Admit: 2020-06-11 | Discharge: 2020-06-11 | Disposition: A | Discharge status: Home / Self Care | Payer: PPO | Source: Ambulatory Visit | Attending: Internal Medicine | Admitting: Internal Medicine

## 2020-06-11 ENCOUNTER — Other Ambulatory Visit: Payer: Self-pay

## 2020-06-11 DIAGNOSIS — Z1231 Encounter for screening mammogram for malignant neoplasm of breast: Secondary | ICD-10-CM | POA: Insufficient documentation

## 2020-09-05 DIAGNOSIS — Z1389 Encounter for screening for other disorder: Secondary | ICD-10-CM | POA: Diagnosis not present

## 2020-09-05 DIAGNOSIS — E663 Overweight: Secondary | ICD-10-CM | POA: Diagnosis not present

## 2020-09-05 DIAGNOSIS — Z1331 Encounter for screening for depression: Secondary | ICD-10-CM | POA: Diagnosis not present

## 2020-09-05 DIAGNOSIS — I10 Essential (primary) hypertension: Secondary | ICD-10-CM | POA: Diagnosis not present

## 2020-09-05 DIAGNOSIS — E7849 Other hyperlipidemia: Secondary | ICD-10-CM | POA: Diagnosis not present

## 2020-09-05 DIAGNOSIS — M810 Age-related osteoporosis without current pathological fracture: Secondary | ICD-10-CM | POA: Diagnosis not present

## 2020-09-05 DIAGNOSIS — Z0001 Encounter for general adult medical examination with abnormal findings: Secondary | ICD-10-CM | POA: Diagnosis not present

## 2020-09-05 DIAGNOSIS — I700 Atherosclerosis of aorta: Secondary | ICD-10-CM | POA: Diagnosis not present

## 2020-09-05 DIAGNOSIS — Z6826 Body mass index (BMI) 26.0-26.9, adult: Secondary | ICD-10-CM | POA: Diagnosis not present

## 2020-11-23 ENCOUNTER — Emergency Department (HOSPITAL_COMMUNITY)
Admission: EM | Admit: 2020-11-23 | Discharge: 2020-11-23 | Disposition: A | Discharge status: Home / Self Care | Payer: PPO | Attending: Emergency Medicine | Admitting: Emergency Medicine

## 2020-11-23 ENCOUNTER — Encounter (HOSPITAL_COMMUNITY): Payer: Self-pay

## 2020-11-23 ENCOUNTER — Other Ambulatory Visit: Payer: Self-pay

## 2020-11-23 ENCOUNTER — Emergency Department (HOSPITAL_COMMUNITY): Payer: PPO

## 2020-11-23 DIAGNOSIS — I10 Essential (primary) hypertension: Secondary | ICD-10-CM | POA: Insufficient documentation

## 2020-11-23 DIAGNOSIS — Z79899 Other long term (current) drug therapy: Secondary | ICD-10-CM | POA: Insufficient documentation

## 2020-11-23 DIAGNOSIS — Z91040 Latex allergy status: Secondary | ICD-10-CM | POA: Diagnosis not present

## 2020-11-23 DIAGNOSIS — R079 Chest pain, unspecified: Secondary | ICD-10-CM | POA: Insufficient documentation

## 2020-11-23 DIAGNOSIS — R0789 Other chest pain: Secondary | ICD-10-CM | POA: Diagnosis not present

## 2020-11-23 LAB — BASIC METABOLIC PANEL
Anion gap: 8 (ref 5–15)
BUN: 15 mg/dL (ref 8–23)
CO2: 25 mmol/L (ref 22–32)
Calcium: 8.6 mg/dL — ABNORMAL LOW (ref 8.9–10.3)
Chloride: 106 mmol/L (ref 98–111)
Creatinine, Ser: 0.8 mg/dL (ref 0.44–1.00)
GFR, Estimated: >60 mL/min (ref >60)
Glucose, Bld: 102 mg/dL — ABNORMAL HIGH (ref 70–99)
Potassium: 3.8 mmol/L (ref 3.5–5.1)
Sodium: 139 mmol/L (ref 135–145)

## 2020-11-23 LAB — CBC WITH DIFFERENTIAL/PLATELET
Abs Immature Granulocytes: 0.02 10*3/uL (ref 0.00–0.07)
Basophils Absolute: 0 10*3/uL (ref 0.0–0.1)
Basophils Relative: 1 %
Eosinophils Absolute: 0.1 10*3/uL (ref 0.0–0.5)
Eosinophils Relative: 1 %
HCT: 45 % (ref 36.0–46.0)
Hemoglobin: 14.3 g/dL (ref 12.0–15.0)
Immature Granulocytes: 0 %
Lymphocytes Relative: 28 %
Lymphs Abs: 1.8 10*3/uL (ref 0.7–4.0)
MCH: 28.9 pg (ref 26.0–34.0)
MCHC: 31.8 g/dL (ref 30.0–36.0)
MCV: 90.9 fL (ref 80.0–100.0)
Monocytes Absolute: 0.5 10*3/uL (ref 0.1–1.0)
Monocytes Relative: 9 %
Neutro Abs: 3.9 10*3/uL (ref 1.7–7.7)
Neutrophils Relative %: 61 %
Platelets: 310 10*3/uL (ref 150–400)
RBC: 4.95 MIL/uL (ref 3.87–5.11)
RDW: 13.2 % (ref 11.5–15.5)
WBC: 6.4 10*3/uL (ref 4.0–10.5)
nRBC: 0 % (ref 0.0–0.2)

## 2020-11-23 LAB — TROPONIN I (HIGH SENSITIVITY)
Troponin I (High Sensitivity): 7 ng/L (ref <18)
Troponin I (High Sensitivity): 7 ng/L (ref <18)

## 2020-11-23 NOTE — ED Provider Notes (Signed)
[facility] EMERGENCY DEPARTMENT Provider Note   CSN: [id number] Arrival date & time: 11/23/20  [id number]     History Chief Complaint  Patient presents with   Chest Pain    Tracy Townsend is a 80 y.o. female.  Patient presents chief complaint of chest pain he describes as a pressure-like ache in the mid chest.  Off and on for the past 3 days.  No associated diaphoresis or palpitations.  No nausea no vomiting no cough no diarrhea no fevers reported.  Currently denies any pain.      Past Medical History:  Diagnosis Date   Hyperlipemia    Hypertension     Patient Active Problem List   Diagnosis Date Noted   Constipation 01/05/2020   Abdominal pain 01/05/2020   KNEE, ARTHRITIS, DEGEN./OSTEO 12/14/2008   DERANGEMENT MENISCUS 12/14/2008   KNEE PAIN 12/14/2008    Past Surgical History:  Procedure Laterality Date   ABDOMINAL HYSTERECTOMY     APPENDECTOMY     bladder surg     CARDIAC CATHETERIZATION     CHOLECYSTECTOMY     COLONOSCOPY N/A 09/07/2014   Procedure: COLONOSCOPY;  Surgeon: Rogene Houston, MD;  Location: [facility];  Service: Endoscopy;  Laterality: N/A;  730   COLONOSCOPY WITH PROPOFOL N/A 02/10/2020   Procedure: COLONOSCOPY WITH PROPOFOL;  Surgeon: Harvel Quale, MD;  Location: [facility];  Service: Gastroenterology;  Laterality: N/A;  [place]     POLYPECTOMY  02/10/2020   Procedure: POLYPECTOMY;  Surgeon: Harvel Quale, MD;  Location: [facility];  Service: Gastroenterology;;     OB History   No obstetric history on file.     Family History  Problem Relation Age of Onset   Skin cancer Mother    Skin cancer Father    Prostate cancer Brother    Healthy Brother    Colon cancer Maternal Grandmother     Social History   Tobacco Use   Smoking status: Never   Smokeless tobacco: Never  Substance Use Topics   Alcohol use: No   Drug use: No    Home Medications Prior to Admission  medications   Medication Sig Start Date End Date Taking? Authorizing Provider  calcitonin, salmon, (MIACALCIN/FORTICAL) 200 UNIT/ACT nasal spray Place 1 spray into alternate nostrils daily. 12/02/17  Yes [provider]  cholecalciferol (VITAMIN D3) 25 MCG (1000 UNIT) tablet Take 1,000 Units by mouth daily.   Yes [provider]  losartan (COZAAR) 100 MG tablet Take 100 mg by mouth at bedtime.  11/24/17  Yes [provider]  Multiple Vitamin (MULTIVITAMIN WITH MINERALS) TABS tablet Take 1 tablet by mouth daily.   Yes [provider]  pravastatin (PRAVACHOL) 20 MG tablet Take 20 mg by mouth at bedtime. 11/30/19  Yes [provider]  vitamin B-12 (CYANOCOBALAMIN) 500 MCG tablet Take 500 mcg by mouth daily.    Yes [provider]    Allergies    Morphine and related and Latex  Review of Systems   Review of Systems  Constitutional:  Negative for fever.  HENT:  Negative for ear pain.   Eyes:  Negative for pain.  Respiratory:  Negative for cough.   Cardiovascular:  Positive for chest pain.  Gastrointestinal:  Negative for abdominal pain.  Genitourinary:  Negative for flank pain.  Musculoskeletal:  Negative for back pain.  Skin:  Negative for rash.  Neurological:  Negative for headaches.  Physical Exam Updated Vital Signs BP (!) 158/77   Pulse 70   Temp 98.1 F (36.7 C) (Oral)   Resp 14   Ht '5\' 2"'$  (1.575 m)   Wt 59 kg   SpO2 98%   BMI 23.78 kg/m   Physical Exam Constitutional:      General: She is not in acute distress.    Appearance: Normal appearance.  HENT:     Head: Normocephalic.     Nose: Nose normal.  Eyes:     Extraocular Movements: Extraocular movements intact.  Cardiovascular:     Rate and Rhythm: Normal rate.  Pulmonary:     Effort: Pulmonary effort is normal.  Musculoskeletal:        General: Normal range of motion.     Cervical back: Normal range of motion.  Neurological:     General: No focal deficit  present.     Mental Status: She is alert. Mental status is at baseline.    ED Results / Procedures / Treatments   Labs (all labs ordered are listed, but only abnormal results are displayed) Labs Reviewed  BASIC METABOLIC PANEL - Abnormal; Notable for the following components:      Result Value   Glucose, Bld 102 (*)    Calcium 8.6 (*)    All other components within normal limits  CBC WITH DIFFERENTIAL/PLATELET  TROPONIN I (HIGH SENSITIVITY)  TROPONIN I (HIGH SENSITIVITY)    EKG EKG Interpretation  Date/Time:  Friday November 23 2020 09:25:05 EDT Ventricular Rate:  76 PR Interval:  131 QRS Duration: 98 QT Interval:  400 QTC Calculation: 450 R Axis:   -57 Text Interpretation: Sinus rhythm Inferior infarct, old Anterior infarct, old Confirmed by Thamas Jaegers ([id number]) on 11/23/2020 10:35:06 AM  Radiology DG Chest Port 1 View  Result Date: 11/23/2020 CLINICAL DATA:  Chest pain. EXAM: PORTABLE CHEST 1 VIEW COMPARISON:  01/30/2018 FINDINGS: 0956 hours. The lungs are clear without focal pneumonia, edema, pneumothorax or pleural effusion. The cardiopericardial silhouette is within normal limits for size. Bones are diffusely demineralized. Telemetry leads overlie the chest. IMPRESSION: No active disease. Electronically Signed   By: Misty Stanley M.D.   On: 11/23/2020 10:28    Procedures Procedures   Medications Ordered in ED Medications - No data to display  ED Course  I have reviewed the triage vital signs and the nursing notes.  Pertinent labs & imaging results that were available during my care of the patient were reviewed by me and considered in my medical decision making (see chart for details).    MDM Rules/Calculators/A&P                           Labs unremarkable white count and chemistry normal.  2 sets of troponin sent both are unremarkable.  Patient remains asymptomatic at this time.  Given work-up today I feel the patient stable for continued outpatient follow-up  with cardiology.  Recommending no strenuous activity, recommending immediate return for worsening symptoms fevers pain or any additional concerns.  Final Clinical Impression(s) / ED Diagnoses Final diagnoses:  Nonspecific chest pain    Rx / DC Orders ED Discharge Orders     None        Luna Fuse, MD 11/23/20 1354

## 2020-11-23 NOTE — Discharge Instructions (Addendum)
Your tests were normal today.  Avoid strenuous activities.    Call your cardiologist in the next 2-3 days.   Return immediately back to the ER if:  Your symptoms worsen within the next 12-24 hours. You develop new symptoms such as new fevers, persistent vomiting, new pain, shortness of breath, or new weakness or numbness, or if you have any other concerns.

## 2020-11-23 NOTE — ED Triage Notes (Signed)
Pt arrived in POV for chest pain for 3 days mid chest. No n/v.

## 2020-11-27 ENCOUNTER — Encounter (INDEPENDENT_AMBULATORY_CARE_PROVIDER_SITE_OTHER): Payer: Self-pay | Admitting: Internal Medicine

## 2020-11-27 ENCOUNTER — Other Ambulatory Visit: Payer: Self-pay

## 2020-11-27 ENCOUNTER — Ambulatory Visit (INDEPENDENT_AMBULATORY_CARE_PROVIDER_SITE_OTHER): Payer: PPO | Admitting: Internal Medicine

## 2020-11-27 DIAGNOSIS — K589 Irritable bowel syndrome without diarrhea: Secondary | ICD-10-CM

## 2020-11-27 DIAGNOSIS — R0789 Other chest pain: Secondary | ICD-10-CM | POA: Diagnosis not present

## 2020-11-27 DIAGNOSIS — R635 Abnormal weight gain: Secondary | ICD-10-CM | POA: Diagnosis not present

## 2020-11-27 MED ORDER — PANTOPRAZOLE SODIUM 40 MG PO TBEC: 40.0000 mg | DELAYED_RELEASE_TABLET | Freq: Every day | ORAL | 2 refills | Status: DC

## 2020-11-27 NOTE — Progress Notes (Signed)
Presenting complaint;  Follow-up for bowel issues. Patient states she was seen in ER 4 days ago for chest tightness.  Database and subjective:  Patient is 80-year-old Caucasian female who has a history of IBS with postprandial diarrhea for years.  She noted change in her bowel habits.  She was passing fecal balls.  She was seen by Dr. Castaneda in October 2021 and underwent colonoscopy in November 2021.  She had 4 small polyps removed.  A few are tubular adenomas and 1 was sessile serrated polyp.  She also had focal area of congestion and sigmoid colon as well as sigmoid diverticulosis internal and external hemorrhoids. Dr. Castaneda recommended high-fiber diet.  Patient stated she is not constipated anymore.  She is back to her baseline.  She has bowel movement usually after every meal.  On worst days she has as many as 5 stools per day.  She never has nocturnal bowel movement.  She experienced chest tightness and was seen in emergency room 4 days ago.  Troponin levels were negative.  EKG did not reveal any acute abnormality and chest x-ray was unremarkable. She says she has been under a lot of stress.  She knew Mr. David Hundley who was a neighbor and she has lost another friend Ms. Betty Carol.  She has not had any more chest tightness. She has heartburn once in a while with certain foods.  She denies solid food dysphagia but she does strangled on saliva sometimes.  She has good appetite.  She has gained 16 pounds since her last visit of October 2021.  She says she eats 3 meals a day and multiple snacks.  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  She is taking ibuprofen 2-3 times a week.  Current Medications: Outpatient Encounter Medications as of 11/27/2020  Medication Sig   acetaminophen (TYLENOL) 325 MG tablet Take 650 mg by mouth every 6 (six) hours as needed.   calcitonin, salmon, (MIACALCIN/FORTICAL) 200 UNIT/ACT nasal spray Place 1 spray into alternate  nostrils daily.   cholecalciferol (VITAMIN D3) 25 MCG (1000 UNIT) tablet Take 1,000 Units by mouth daily.   ibuprofen (ADVIL) 200 MG tablet Take 200 mg by mouth every 6 (six) hours as needed.   losartan (COZAAR) 100 MG tablet Take 100 mg by mouth at bedtime.    Multiple Vitamin (MULTIVITAMIN WITH MINERALS) TABS tablet Take 1 tablet by mouth daily.   pravastatin (PRAVACHOL) 20 MG tablet Take 20 mg by mouth at bedtime.   vitamin B-12 (CYANOCOBALAMIN) 500 MCG tablet Take 500 mcg by mouth daily.    No facility-administered encounter medications on file as of 11/27/2020.     Objective: Blood pressure (!) 183/99, pulse 80, temperature 98.6 F (37 C), temperature source Oral, height 5' (1.524 m), weight 143 lb 9.6 oz (65.1 kg). Patient is alert and in no acute distress. Conjunctiva is pink. Sclera is nonicteric Oropharyngeal mucosa is normal. She has partial upper and lower dentures. No neck masses or thyromegaly noted. Cardiac exam with regular rhythm normal S1 and S2. No murmur or gallop noted. Lungs are clear to auscultation. Abdomen is full but soft and nontender with organomegaly or masses. No LE edema or clubbing noted.  Labs/studies Results:   CBC Latest Ref Rng & Units 11/23/2020 01/05/2020 01/30/2018  WBC 4.0 - 10.5 K/uL 6.4 7.6 11.9(H)  Hemoglobin 12.0 - 15.0 g/dL 14.3 14.4 13.2  Hematocrit 36.0 - 46.0 % 45.0 44.0 42.9  Platelets 150 -   400 K/uL 310 372 300    CMP Latest Ref Rng & Units 11/23/2020 01/05/2020 01/30/2018  Glucose 70 - 99 mg/dL 102(H) 102 150(H)  BUN 8 - 23 mg/dL _0 Creatinine 0.44 - 1.00 mg/dL 0.80 0.88 0.74  Sodium 135 - 145 mmol/L 139 140 141  Potassium 3.5 - 5.1 mmol/L 3.8 3.9 3.2(L)  Chloride 98 - 111 mmol/L 106 104 108  CO2 22 - 32 mmol/L _1 Calcium 8.9 - 10.3 mg/dL 8.6(L) 9.2 8.7(L)  Total Protein 6.1 - 8.1 g/dL - 7.1 7.2  Total Bilirubin 0.2 - 1.2 mg/dL - 0.5 0.9  Alkaline Phos 38 - 126 U/L - - 114  AST 10 - 35 U/L - 14 16  ALT 6 - 29 U/L -  11 12    Hepatic Function Latest Ref Rng & Units 01/05/2020 01/30/2018 07/04/2012  Total Protein 6.1 - 8.1 g/dL 7.1 7.2 7.8  Albumin 3.5 - 5.0 g/dL - 3.4(L) 3.7  AST 10 - 35 U/L _2 ALT 6 - 29 U/L _3 Alk Phosphatase 38 - 126 U/L - 114 106  Total Bilirubin 0.2 - 1.2 mg/dL 0.5 0.9 0.5  Bilirubin, Direct 0.0 - 0.3 mg/dL - - -    Lab data from 11/23/2020 reviewed no LFTs performed.  Assessment:  #1.  IBS.  She is back to her baseline.  She had colonoscopy by Dr. Lyndle Herrlich done in November 2021 because she had become constipated. Would not recommend antispasmodic given her age.  She just needs to be on high-fiber diet.  #2.  Colonic polyps.  Colonoscopy in November 2021 revealed 4 small polyps.  3 were tubular adenomas and 1 was sessile serrated polyp.  Given her age Dr. Orrin Brigham I recommended observation rather than automatic surveillance exam in 5 years.  #3.  Atypical chest pain.  Work-up in emergency room 4 days ago revealed normal troponin levels x2.  She has not had any more chest pain.  She has no heartburn or regurgitation suggest GI etiology of her pain.  I did notice she did not have LFTs during recent ER visit.  Therefore will check LFTs to make sure she does not have biliary tract problem.  If pain recurs she will follow-up with PCP.  #4.  Weight gain.  She has gained 16 pounds in last 8-1/2 months.  Will screen for hypothyroidism.   Plan:  High-fiber diet. Patient will go to the lab for LFTs and TSH. Patient will call if frequency of strangling and saliva increases in which case we will proceed with evaluation by speech pathologist. Office visit in 1 year.

## 2020-11-27 NOTE — Progress Notes (Signed)
error 

## 2020-11-27 NOTE — Patient Instructions (Signed)
Physician will call with results of blood tests 

## 2020-11-28 LAB — HEPATIC FUNCTION PANEL
AG Ratio: 1.2 (calc) (ref 1.0–2.5)
ALT: 14 U/L (ref 6–29)
AST: 19 U/L (ref 10–35)
Albumin: 3.8 g/dL (ref 3.6–5.1)
Alkaline phosphatase (APISO): 114 U/L (ref 37–153)
Bilirubin, Direct: 0.1 mg/dL (ref 0.0–0.2)
Globulin: 3.1 g/dL (calc) (ref 1.9–3.7)
Indirect Bilirubin: 0.6 mg/dL (calc) (ref 0.2–1.2)
Total Bilirubin: 0.7 mg/dL (ref 0.2–1.2)
Total Protein: 6.9 g/dL (ref 6.1–8.1)

## 2020-11-28 LAB — TSH: TSH: 1.26 mIU/L (ref 0.40–4.50)

## 2020-11-30 DIAGNOSIS — I10 Essential (primary) hypertension: Secondary | ICD-10-CM | POA: Diagnosis not present

## 2020-11-30 DIAGNOSIS — Z6828 Body mass index (BMI) 28.0-28.9, adult: Secondary | ICD-10-CM | POA: Diagnosis not present

## 2020-11-30 DIAGNOSIS — M1991 Primary osteoarthritis, unspecified site: Secondary | ICD-10-CM | POA: Diagnosis not present

## 2020-11-30 DIAGNOSIS — R519 Headache, unspecified: Secondary | ICD-10-CM | POA: Diagnosis not present

## 2020-11-30 DIAGNOSIS — E663 Overweight: Secondary | ICD-10-CM | POA: Diagnosis not present

## 2020-12-04 DIAGNOSIS — M170 Bilateral primary osteoarthritis of knee: Secondary | ICD-10-CM | POA: Diagnosis not present

## 2020-12-11 DIAGNOSIS — M170 Bilateral primary osteoarthritis of knee: Secondary | ICD-10-CM | POA: Diagnosis not present

## 2020-12-18 DIAGNOSIS — M170 Bilateral primary osteoarthritis of knee: Secondary | ICD-10-CM | POA: Diagnosis not present

## 2021-01-10 ENCOUNTER — Ambulatory Visit (INDEPENDENT_AMBULATORY_CARE_PROVIDER_SITE_OTHER): Payer: PPO | Admitting: Gastroenterology

## 2021-01-18 ENCOUNTER — Ambulatory Visit: Payer: PPO | Admitting: Internal Medicine

## 2021-02-04 ENCOUNTER — Other Ambulatory Visit (INDEPENDENT_AMBULATORY_CARE_PROVIDER_SITE_OTHER): Payer: Self-pay | Admitting: Internal Medicine

## 2021-02-22 ENCOUNTER — Other Ambulatory Visit (HOSPITAL_COMMUNITY): Payer: Self-pay | Admitting: Internal Medicine

## 2021-02-22 DIAGNOSIS — Z1231 Encounter for screening mammogram for malignant neoplasm of breast: Secondary | ICD-10-CM

## 2021-03-07 DIAGNOSIS — D225 Melanocytic nevi of trunk: Secondary | ICD-10-CM | POA: Diagnosis not present

## 2021-03-07 DIAGNOSIS — L821 Other seborrheic keratosis: Secondary | ICD-10-CM | POA: Diagnosis not present

## 2021-06-13 ENCOUNTER — Ambulatory Visit (HOSPITAL_COMMUNITY): Payer: PPO

## 2021-06-14 ENCOUNTER — Other Ambulatory Visit: Payer: Self-pay

## 2021-06-14 ENCOUNTER — Ambulatory Visit (HOSPITAL_COMMUNITY)
Admission: RE | Admit: 2021-06-14 | Discharge: 2021-06-14 | Disposition: A | Discharge status: Home / Self Care | Payer: PPO | Source: Ambulatory Visit | Attending: Internal Medicine | Admitting: Internal Medicine

## 2021-06-14 DIAGNOSIS — Z1231 Encounter for screening mammogram for malignant neoplasm of breast: Secondary | ICD-10-CM | POA: Diagnosis not present

## 2021-07-02 DIAGNOSIS — M170 Bilateral primary osteoarthritis of knee: Secondary | ICD-10-CM | POA: Diagnosis not present

## 2021-07-09 DIAGNOSIS — E782 Mixed hyperlipidemia: Secondary | ICD-10-CM | POA: Diagnosis not present

## 2021-07-09 DIAGNOSIS — Z0001 Encounter for general adult medical examination with abnormal findings: Secondary | ICD-10-CM | POA: Diagnosis not present

## 2021-07-09 DIAGNOSIS — E663 Overweight: Secondary | ICD-10-CM | POA: Diagnosis not present

## 2021-07-09 DIAGNOSIS — M1991 Primary osteoarthritis, unspecified site: Secondary | ICD-10-CM | POA: Diagnosis not present

## 2021-07-09 DIAGNOSIS — Z1331 Encounter for screening for depression: Secondary | ICD-10-CM | POA: Diagnosis not present

## 2021-07-09 DIAGNOSIS — I10 Essential (primary) hypertension: Secondary | ICD-10-CM | POA: Diagnosis not present

## 2021-07-09 DIAGNOSIS — M810 Age-related osteoporosis without current pathological fracture: Secondary | ICD-10-CM | POA: Diagnosis not present

## 2021-07-09 DIAGNOSIS — Z6828 Body mass index (BMI) 28.0-28.9, adult: Secondary | ICD-10-CM | POA: Diagnosis not present

## 2021-07-10 ENCOUNTER — Ambulatory Visit (INDEPENDENT_AMBULATORY_CARE_PROVIDER_SITE_OTHER): Payer: PPO | Admitting: Orthopaedic Surgery

## 2021-07-10 ENCOUNTER — Encounter: Payer: Self-pay | Admitting: Orthopaedic Surgery

## 2021-07-10 DIAGNOSIS — M25562 Pain in left knee: Secondary | ICD-10-CM | POA: Diagnosis not present

## 2021-07-10 DIAGNOSIS — M170 Bilateral primary osteoarthritis of knee: Secondary | ICD-10-CM

## 2021-07-10 DIAGNOSIS — M25561 Pain in right knee: Secondary | ICD-10-CM | POA: Diagnosis not present

## 2021-07-10 MED ORDER — SODIUM HYALURONATE (VISCOSUP) 25 MG/2.5ML IX SOSY
25.0000 mg | PREFILLED_SYRINGE | INTRA_ARTICULAR | Status: AC | PRN
  Administered 2021-07-10: 25 mg via INTRA_ARTICULAR

## 2021-07-10 NOTE — Progress Notes (Signed)
? ?Office Visit Note ?  ?Patient: Tracy Townsend           ?Date of Birth: [birth year]           ?MRN: [id number] ?Visit Date: 07/10/2021 ?             ?Requested by: Redmond School, MD ?[address] ?[place],  [place] [address] ?PCP: Redmond School, MD ? ? ?Assessment & Plan: ?Visit Diagnoses: Bilateral knee arthritis ? ?Plan: The patient is a pleasant 81 year old woman bilateral Supartz injections.  She tolerated the first set quite well though has not felt any significant relief denies any redness or swelling in her knees ? ?Follow-Up Instructions: No follow-ups on file.  ? ?Orders:  ?No orders of the defined types were placed in this encounter. ? ?No orders of the defined types were placed in this encounter. ? ? ? ? Procedures: ?Large Joint Inj: bilateral knee on 07/10/2021 1:59 PM ?Indications: pain and diagnostic evaluation ?Details: 25 G 1.5 in needle, anteromedial approach ? ?Arthrogram: No ? ?Medications (Right): 25 mg Sodium Hyaluronate 25 MG/2.5ML ?Medications (Left): 25 mg Sodium Hyaluronate 25 MG/2.5ML ?Outcome: tolerated well, no immediate complications ?Procedure, treatment alternatives, risks and benefits explained, specific risks discussed. Consent was given by the patient.  ? ? ? ? ?Clinical Data: ?No additional findings. ? ? ?Subjective: ?Chief Complaint  ?Patient presents with  ? Right Knee - Follow-up  ?  Supartz bilaterally #2  ? Left Knee - Follow-up  ?Patient presents today for the second Supartz injections bilaterally. ? ? ? ?Review of Systems  ?All other systems reviewed and are negative. ? ? ?Objective: ?Vital Signs: There were no vitals taken for this visit. ? ?Physical Exam ?Patient appears well is pleasant to exam ?Ortho Exam ?Bilateral knees no effusion no redness no cellulitis ?Specialty Comments:  ?No specialty comments available. ? ?Imaging: ?No results found. ? ? ?PMFS History: ?Patient Active Problem List  ? Diagnosis Date Noted  ? Weight gain 11/27/2020  ? Atypical chest pain  11/27/2020  ? IBS (irritable bowel syndrome) 11/27/2020  ? Constipation 01/05/2020  ? Abdominal pain 01/05/2020  ? KNEE, ARTHRITIS, DEGEN./OSTEO 12/14/2008  ? DERANGEMENT MENISCUS 12/14/2008  ? KNEE PAIN 12/14/2008  ? ?Past Medical History:  ?Diagnosis Date  ? Hyperlipemia   ? Hypertension   ?  ?Family History  ?Problem Relation Age of Onset  ? Skin cancer Mother   ? Skin cancer Father   ? Prostate cancer Brother   ? Healthy Brother   ? Colon cancer Maternal Grandmother   ?  ?Past Surgical History:  ?Procedure Laterality Date  ? ABDOMINAL HYSTERECTOMY    ? APPENDECTOMY    ? bladder surg    ? CARDIAC CATHETERIZATION    ? CHOLECYSTECTOMY    ? COLONOSCOPY N/A 09/07/2014  ? Procedure: COLONOSCOPY;  Surgeon: Rogene Houston, MD;  [facility]: [facility];  Service: Endoscopy;  Laterality: N/A;  730  ? COLONOSCOPY WITH PROPOFOL N/A 02/10/2020  ? Procedure: COLONOSCOPY WITH PROPOFOL;  Surgeon: Harvel Quale, MD;  [facility];  Service: Gastroenterology;  Laterality: N/A;  145  ? HEMORRHOID SURGERY    ? KNEE SURGERY    ? POLYPECTOMY  02/10/2020  ? Procedure: POLYPECTOMY;  Surgeon: Harvel Quale, MD;  Location: [facility];  Service: Gastroenterology;;  ? ?Social History  ? ?Occupational History  ? Not on file  ?Tobacco Use  ? Smoking status: Never  ? Smokeless tobacco: Never  ?Vaping Use  ?  Vaping Use: Never used  ?Substance and Sexual Activity  ? Alcohol use: No  ? Drug use: No  ? Sexual activity: Not on file  ? ? ? ? ? ? ?

## 2021-07-16 DIAGNOSIS — M170 Bilateral primary osteoarthritis of knee: Secondary | ICD-10-CM | POA: Diagnosis not present

## 2021-09-16 ENCOUNTER — Other Ambulatory Visit (INDEPENDENT_AMBULATORY_CARE_PROVIDER_SITE_OTHER): Payer: Self-pay | Admitting: Internal Medicine

## 2022-01-28 ENCOUNTER — Encounter (HOSPITAL_COMMUNITY): Payer: Self-pay | Admitting: Internal Medicine

## 2022-02-25 ENCOUNTER — Other Ambulatory Visit (HOSPITAL_COMMUNITY): Payer: Self-pay | Admitting: Internal Medicine

## 2022-02-25 DIAGNOSIS — Z1231 Encounter for screening mammogram for malignant neoplasm of breast: Secondary | ICD-10-CM

## 2022-03-03 DIAGNOSIS — M170 Bilateral primary osteoarthritis of knee: Secondary | ICD-10-CM | POA: Diagnosis not present

## 2022-06-16 ENCOUNTER — Ambulatory Visit (HOSPITAL_COMMUNITY)
Admission: RE | Admit: 2022-06-16 | Discharge: 2022-06-16 | Disposition: A | Discharge status: Home / Self Care | Payer: PPO | Source: Ambulatory Visit | Attending: Internal Medicine | Admitting: Internal Medicine

## 2022-06-16 DIAGNOSIS — Z1231 Encounter for screening mammogram for malignant neoplasm of breast: Secondary | ICD-10-CM | POA: Diagnosis not present

## 2022-07-14 DIAGNOSIS — Z6828 Body mass index (BMI) 28.0-28.9, adult: Secondary | ICD-10-CM | POA: Diagnosis not present

## 2022-07-14 DIAGNOSIS — M1991 Primary osteoarthritis, unspecified site: Secondary | ICD-10-CM | POA: Diagnosis not present

## 2022-07-14 DIAGNOSIS — Z0001 Encounter for general adult medical examination with abnormal findings: Secondary | ICD-10-CM | POA: Diagnosis not present

## 2022-07-14 DIAGNOSIS — Z1331 Encounter for screening for depression: Secondary | ICD-10-CM | POA: Diagnosis not present

## 2022-07-14 DIAGNOSIS — I10 Essential (primary) hypertension: Secondary | ICD-10-CM | POA: Diagnosis not present

## 2022-07-14 DIAGNOSIS — E663 Overweight: Secondary | ICD-10-CM | POA: Diagnosis not present

## 2022-07-14 DIAGNOSIS — M810 Age-related osteoporosis without current pathological fracture: Secondary | ICD-10-CM | POA: Diagnosis not present

## 2022-08-26 DIAGNOSIS — M170 Bilateral primary osteoarthritis of knee: Secondary | ICD-10-CM | POA: Diagnosis not present

## 2022-11-20 ENCOUNTER — Ambulatory Visit: Payer: PPO | Admitting: Orthopedic Surgery

## 2022-11-27 ENCOUNTER — Ambulatory Visit: Payer: PPO | Admitting: Orthopedic Surgery

## 2022-12-02 DIAGNOSIS — M170 Bilateral primary osteoarthritis of knee: Secondary | ICD-10-CM | POA: Diagnosis not present

## 2023-01-15 DIAGNOSIS — L821 Other seborrheic keratosis: Secondary | ICD-10-CM | POA: Diagnosis not present

## 2023-02-23 DIAGNOSIS — H01005 Unspecified blepharitis left lower eyelid: Secondary | ICD-10-CM | POA: Diagnosis not present

## 2023-02-23 DIAGNOSIS — H25813 Combined forms of age-related cataract, bilateral: Secondary | ICD-10-CM | POA: Diagnosis not present

## 2023-02-23 DIAGNOSIS — H40013 Open angle with borderline findings, low risk, bilateral: Secondary | ICD-10-CM | POA: Diagnosis not present

## 2023-02-23 DIAGNOSIS — H353131 Nonexudative age-related macular degeneration, bilateral, early dry stage: Secondary | ICD-10-CM | POA: Diagnosis not present

## 2023-02-23 DIAGNOSIS — H01002 Unspecified blepharitis right lower eyelid: Secondary | ICD-10-CM | POA: Diagnosis not present

## 2023-02-23 DIAGNOSIS — H02834 Dermatochalasis of left upper eyelid: Secondary | ICD-10-CM | POA: Diagnosis not present

## 2023-02-23 DIAGNOSIS — H01001 Unspecified blepharitis right upper eyelid: Secondary | ICD-10-CM | POA: Diagnosis not present

## 2023-02-23 DIAGNOSIS — H43812 Vitreous degeneration, left eye: Secondary | ICD-10-CM | POA: Diagnosis not present

## 2023-02-23 DIAGNOSIS — H11823 Conjunctivochalasis, bilateral: Secondary | ICD-10-CM | POA: Diagnosis not present

## 2023-02-23 DIAGNOSIS — H02831 Dermatochalasis of right upper eyelid: Secondary | ICD-10-CM | POA: Diagnosis not present

## 2023-02-23 DIAGNOSIS — H01004 Unspecified blepharitis left upper eyelid: Secondary | ICD-10-CM | POA: Diagnosis not present

## 2023-02-26 ENCOUNTER — Other Ambulatory Visit (HOSPITAL_COMMUNITY): Payer: Self-pay | Admitting: Internal Medicine

## 2023-02-26 DIAGNOSIS — Z1231 Encounter for screening mammogram for malignant neoplasm of breast: Secondary | ICD-10-CM

## 2023-06-08 DIAGNOSIS — M170 Bilateral primary osteoarthritis of knee: Secondary | ICD-10-CM | POA: Diagnosis not present

## 2023-06-18 ENCOUNTER — Ambulatory Visit (HOSPITAL_COMMUNITY): Payer: PPO

## 2023-06-18 ENCOUNTER — Ambulatory Visit (HOSPITAL_COMMUNITY)
Admission: RE | Admit: 2023-06-18 | Discharge: 2023-06-18 | Disposition: A | Discharge status: Home / Self Care | Source: Ambulatory Visit | Attending: Internal Medicine | Admitting: Internal Medicine

## 2023-06-18 DIAGNOSIS — Z1231 Encounter for screening mammogram for malignant neoplasm of breast: Secondary | ICD-10-CM | POA: Insufficient documentation

## 2023-07-20 DIAGNOSIS — E663 Overweight: Secondary | ICD-10-CM | POA: Diagnosis not present

## 2023-07-20 DIAGNOSIS — M1991 Primary osteoarthritis, unspecified site: Secondary | ICD-10-CM | POA: Diagnosis not present

## 2023-07-20 DIAGNOSIS — Z0001 Encounter for general adult medical examination with abnormal findings: Secondary | ICD-10-CM | POA: Diagnosis not present

## 2023-07-20 DIAGNOSIS — M810 Age-related osteoporosis without current pathological fracture: Secondary | ICD-10-CM | POA: Diagnosis not present

## 2023-07-20 DIAGNOSIS — I10 Essential (primary) hypertension: Secondary | ICD-10-CM | POA: Diagnosis not present

## 2023-07-20 DIAGNOSIS — Z1331 Encounter for screening for depression: Secondary | ICD-10-CM | POA: Diagnosis not present

## 2023-07-20 DIAGNOSIS — Z6826 Body mass index (BMI) 26.0-26.9, adult: Secondary | ICD-10-CM | POA: Diagnosis not present

## 2023-09-14 DIAGNOSIS — M170 Bilateral primary osteoarthritis of knee: Secondary | ICD-10-CM | POA: Diagnosis not present

## 2023-09-30 ENCOUNTER — Encounter (HOSPITAL_COMMUNITY): Payer: Self-pay

## 2023-09-30 ENCOUNTER — Emergency Department (HOSPITAL_COMMUNITY)
Admission: EM | Admit: 2023-09-30 | Discharge: 2023-09-30 | Disposition: A | Discharge status: Home / Self Care | Attending: Emergency Medicine | Admitting: Emergency Medicine

## 2023-09-30 ENCOUNTER — Other Ambulatory Visit: Payer: Self-pay

## 2023-09-30 ENCOUNTER — Encounter: Payer: Self-pay | Admitting: Emergency Medicine

## 2023-09-30 ENCOUNTER — Ambulatory Visit: Admission: EM | Admit: 2023-09-30 | Discharge: 2023-09-30 | Discharge status: Against Medical Advice

## 2023-09-30 ENCOUNTER — Emergency Department (HOSPITAL_COMMUNITY)

## 2023-09-30 DIAGNOSIS — M47812 Spondylosis without myelopathy or radiculopathy, cervical region: Secondary | ICD-10-CM | POA: Diagnosis not present

## 2023-09-30 DIAGNOSIS — M4802 Spinal stenosis, cervical region: Secondary | ICD-10-CM | POA: Diagnosis not present

## 2023-09-30 DIAGNOSIS — Z79899 Other long term (current) drug therapy: Secondary | ICD-10-CM | POA: Insufficient documentation

## 2023-09-30 DIAGNOSIS — I10 Essential (primary) hypertension: Secondary | ICD-10-CM | POA: Insufficient documentation

## 2023-09-30 DIAGNOSIS — S161XXA Strain of muscle, fascia and tendon at neck level, initial encounter: Secondary | ICD-10-CM | POA: Diagnosis not present

## 2023-09-30 DIAGNOSIS — E876 Hypokalemia: Secondary | ICD-10-CM

## 2023-09-30 DIAGNOSIS — S199XXA Unspecified injury of neck, initial encounter: Secondary | ICD-10-CM | POA: Diagnosis not present

## 2023-09-30 DIAGNOSIS — M542 Cervicalgia: Secondary | ICD-10-CM | POA: Insufficient documentation

## 2023-09-30 DIAGNOSIS — R519 Headache, unspecified: Secondary | ICD-10-CM | POA: Diagnosis not present

## 2023-09-30 DIAGNOSIS — Z91040 Latex allergy status: Secondary | ICD-10-CM | POA: Insufficient documentation

## 2023-09-30 DIAGNOSIS — G43909 Migraine, unspecified, not intractable, without status migrainosus: Secondary | ICD-10-CM | POA: Diagnosis not present

## 2023-09-30 LAB — CBC WITH DIFFERENTIAL/PLATELET
Abs Immature Granulocytes: 0.09 K/uL — ABNORMAL HIGH (ref 0.00–0.07)
Basophils Absolute: 0.1 K/uL (ref 0.0–0.1)
Basophils Relative: 1 %
Eosinophils Absolute: 0 K/uL (ref 0.0–0.5)
Eosinophils Relative: 0 %
HCT: 41.4 % (ref 36.0–46.0)
Hemoglobin: 13.4 g/dL (ref 12.0–15.0)
Immature Granulocytes: 1 %
Lymphocytes Relative: 16 %
Lymphs Abs: 1.6 K/uL (ref 0.7–4.0)
MCH: 27.7 pg (ref 26.0–34.0)
MCHC: 32.4 g/dL (ref 30.0–36.0)
MCV: 85.7 fL (ref 80.0–100.0)
Monocytes Absolute: 0.8 K/uL (ref 0.1–1.0)
Monocytes Relative: 8 %
Neutro Abs: 7.6 K/uL (ref 1.7–7.7)
Neutrophils Relative %: 74 %
Platelets: 429 K/uL — ABNORMAL HIGH (ref 150–400)
RBC: 4.83 MIL/uL (ref 3.87–5.11)
RDW: 13.6 % (ref 11.5–15.5)
WBC: 10.2 K/uL (ref 4.0–10.5)
nRBC: 0 % (ref 0.0–0.2)

## 2023-09-30 LAB — COMPREHENSIVE METABOLIC PANEL WITH GFR
ALT: 16 U/L (ref 0–44)
AST: 21 U/L (ref 15–41)
Albumin: 2.8 g/dL — ABNORMAL LOW (ref 3.5–5.0)
Alkaline Phosphatase: 113 U/L (ref 38–126)
Anion gap: 11 (ref 5–15)
BUN: 14 mg/dL (ref 8–23)
CO2: 23 mmol/L (ref 22–32)
Calcium: 8.6 mg/dL — ABNORMAL LOW (ref 8.9–10.3)
Chloride: 102 mmol/L (ref 98–111)
Creatinine, Ser: 0.73 mg/dL (ref 0.44–1.00)
GFR, Estimated: >60 mL/min (ref >60)
Glucose, Bld: 110 mg/dL — ABNORMAL HIGH (ref 70–99)
Potassium: 2.9 mmol/L — ABNORMAL LOW (ref 3.5–5.1)
Sodium: 136 mmol/L (ref 135–145)
Total Bilirubin: 0.8 mg/dL (ref 0.0–1.2)
Total Protein: 6.6 g/dL (ref 6.5–8.1)

## 2023-09-30 MED ORDER — ACETAMINOPHEN 500 MG PO TABS
1000.0000 mg | ORAL_TABLET | Freq: Once | ORAL | Status: AC
  Administered 2023-09-30: 1000 mg via ORAL
  Filled 2023-09-30: qty 2

## 2023-09-30 MED ORDER — DICLOFENAC SODIUM 1 % EX GEL: 2.0000 g | Freq: Four times a day (QID) | CUTANEOUS | 0 refills | Status: DC

## 2023-09-30 MED ORDER — LIDOCAINE 5 % EX PTCH
1.0000 | MEDICATED_PATCH | CUTANEOUS | Status: DC
  Administered 2023-09-30: 1 via TRANSDERMAL
  Filled 2023-09-30: qty 1

## 2023-09-30 MED ORDER — LIDOCAINE 5 % EX PTCH: 1.0000 | MEDICATED_PATCH | CUTANEOUS | 0 refills | Status: DC

## 2023-09-30 MED ORDER — POTASSIUM CHLORIDE CRYS ER 20 MEQ PO TBCR
40.0000 meq | EXTENDED_RELEASE_TABLET | Freq: Once | ORAL | Status: AC
  Administered 2023-09-30: 40 meq via ORAL
  Filled 2023-09-30: qty 2

## 2023-09-30 NOTE — ED Notes (Signed)
 Discussed pt presentation with provider. Provider reported additional imaging, higher level of care is recommended. Discussed care plan with pt and pt family, pt family elected to be seen in ED prior to provider assessment.

## 2023-09-30 NOTE — ED Provider Notes (Signed)
 [place] EMERGENCY DEPARTMENT AT [facility] Provider Note   CSN: [id number] Arrival date & time: 09/30/23  1010     Patient presents with: Torticollis   Tracy Townsend is a 83 y.o. female.  The ER complaining of bilateral neck pain that goes into the front of her head when she moves her neck times about 2 weeks.  No fevers or chills, no distinct injury or trauma, though her daughter at bedside states that she has been having to use double canes to walk because of her severe knee arthritis and she thinks that using 2 canes his caused her to have muscle spasm.  He did have a muscle relaxer last night with minimal relief.  Went to urgent care today and was told she needed evaluation in the ER.  She is not taking any other medications at home, states she has Tylenol  but does not like to take medications.  She does not have any numbness tingling or weakness, no saddle anesthesia or paresthesia, no bowel or bladder incontinence.   HPI     Prior to Admission medications   Medication Sig Start Date End Date Taking? Authorizing Provider  acetaminophen  (TYLENOL ) 325 MG tablet Take 650 mg by mouth every 6 (six) hours as needed.    [provider]  calcitonin, salmon, (MIACALCIN/FORTICAL) 200 UNIT/ACT nasal spray Place 1 spray into alternate nostrils daily. 12/02/17   [provider]  cholecalciferol (VITAMIN D3) 25 MCG (1000 UNIT) tablet Take 1,000 Units by mouth daily.    [provider]  ibuprofen (ADVIL) 200 MG tablet Take 200 mg by mouth every 6 (six) hours as needed.    [provider]  losartan (COZAAR) 100 MG tablet Take 100 mg by mouth at bedtime.  11/24/17   [provider]  Multiple Vitamin (MULTIVITAMIN WITH MINERALS) TABS tablet Take 1 tablet by mouth daily.    [provider]  pantoprazole  (PROTONIX ) 40 MG tablet Take 1 tablet (40 mg total) by mouth daily before breakfast. 02/04/21   Rehman, Claudis PENNER, MD  pravastatin  (PRAVACHOL) 20 MG tablet Take 20 mg by mouth at bedtime. 11/30/19   [provider]  vitamin B-12 (CYANOCOBALAMIN ) 500 MCG tablet Take 500 mcg by mouth daily.     [provider]    Allergies: Morphine  and codeine and Latex    Review of Systems  Updated Vital Signs BP (!) 171/84   Pulse 78   Temp 99 F (37.2 C) (Oral)   Resp (!) 22   Ht 5' (1.524 m)   Wt 64.9 kg   SpO2 99%   BMI 27.93 kg/m   Physical Exam Vitals and nursing note reviewed.  Constitutional:      General: She is not in acute distress.    Appearance: She is well-developed.  HENT:     Head: Normocephalic and atraumatic.     Comments: No tenderness over temporal arteries    Mouth/Throat:     Mouth: Mucous membranes are moist.  Eyes:     Extraocular Movements: Extraocular movements intact.     Conjunctiva/sclera: Conjunctivae normal.     Pupils: Pupils are equal, round, and reactive to light.  Neck:     Comments: Patient has pain with any range of motion, muscle spasm palpated bilateral paraspinous muscles. Cardiovascular:     Rate and Rhythm: Normal rate and regular rhythm.     Heart sounds: No murmur heard. Pulmonary:     Effort: Pulmonary effort is normal. No  respiratory distress.     Breath sounds: Normal breath sounds.  Abdominal:     Palpations: Abdomen is soft.     Tenderness: There is no abdominal tenderness.  Musculoskeletal:        General: No swelling.     Cervical back: Neck supple.  Lymphadenopathy:     Cervical: No cervical adenopathy.  Skin:    General: Skin is warm and dry.     Capillary Refill: Capillary refill takes less than 2 seconds.  Neurological:     General: No focal deficit present.     Mental Status: She is alert and oriented to person, place, and time.     Motor: No weakness.     Comments: Patient uses 2 canes to ambulate  Psychiatric:        Mood and Affect: Mood normal.     (all labs ordered are listed, but only abnormal results are displayed) Labs  Reviewed  CBC WITH DIFFERENTIAL/PLATELET - Abnormal; Notable for the following components:      Result Value   Platelets 429 (*)    Abs Immature Granulocytes 0.09 (*)    All other components within normal limits  COMPREHENSIVE METABOLIC PANEL WITH GFR - Abnormal; Notable for the following components:   Potassium 2.9 (*)    Glucose, Bld 110 (*)    Calcium 8.6 (*)    Albumin 2.8 (*)    All other components within normal limits    EKG: None  Radiology: CT Cervical Spine Wo Contrast Result Date: 09/30/2023 CLINICAL DATA:  Neck trauma EXAM: CT CERVICAL SPINE WITHOUT CONTRAST TECHNIQUE: Multidetector CT imaging of the cervical spine was performed without intravenous contrast. Multiplanar CT image reconstructions were also generated. RADIATION DOSE REDUCTION: This exam was performed according to the departmental dose-optimization program which includes automated exposure control, adjustment of the mA and/or kV according to patient size and/or use of iterative reconstruction technique. COMPARISON:  Same-day head CT FINDINGS: Alignment: Unremarkable. Skull base and vertebrae: No acute fractures. Soft tissues and spinal canal: Unremarkable. Disc levels: Mild multilevel intervertebral disc space narrowing, endplate osteophytosis, and multilevel facet arthrosis. Upper chest: Unremarkable. Other: None. IMPRESSION: No acute cervical spine fracture. Electronically Signed   By: Michaeline Blanch M.D.   On: 09/30/2023 13:49   CT Head Wo Contrast Result Date: 09/30/2023 CLINICAL DATA:  Headache, migraine EXAM: CT HEAD WITHOUT CONTRAST TECHNIQUE: Contiguous axial images were obtained from the base of the skull through the vertex without intravenous contrast. RADIATION DOSE REDUCTION: This exam was performed according to the departmental dose-optimization program which includes automated exposure control, adjustment of the mA and/or kV according to patient size and/or use of iterative reconstruction technique.  COMPARISON:  June 28, 2011 head CT FINDINGS: Brain: No evidence of acute infarction, hemorrhage, hydrocephalus, extra-axial collection or mass lesion/mass effect. Nonspecific periventricular and subcortical white matter changes, likely related to chronic small vessel ischemia. Vascular: Calcifications of the carotid siphons. Skull: Unremarkable. Sinuses/Orbits: Unremarkable. Other: None. IMPRESSION: No acute intracranial pathology. Electronically Signed   By: Michaeline Blanch M.D.   On: 09/30/2023 13:46     Procedures   Medications Ordered in the ED  lidocaine  (LIDODERM ) 5 % 1 patch (has no administration in time range)  acetaminophen  (TYLENOL ) tablet 1,000 mg (has no administration in time range)  potassium chloride  SA (KLOR-CON  M) CR tablet 40 mEq (has no administration in time range)  Medical Decision Making This patient presents to the ED for concern of neck pain x 2 weeks, this involves an extensive number of treatment options, and is a complaint that carries with it a high risk of complications and morbidity.  The differential diagnosis includes spasm, strain, fracture, contusion, intracranial hemorrhage, migraine, temporal arteritis, other   Co morbidities that complicate the patient evaluation  Arthritis, hypertension   Additional history obtained:  Additional history obtained from EMR External records from outside source obtained and reviewed including prior notes   Lab Tests:  I Ordered, and personally interpreted labs.  The pertinent results include: Potassium 2.9  no leukocytosis or anemia   Imaging Studies ordered:  I ordered imaging studies including CT head and C-spine I independently visualized and interpreted imaging which showed no intracranial hemorrhage, chronic changes C-spine fracture or traumatic malalignment I agree with the radiologist interpretation     Problem List / ED Course / Critical interventions / Medication  management  Pain radiating into head x 2 weeks.  No trauma but patient has been using 2 canes to ambulate because of increased knee pain.  Her daughter at bedside helps provide history and states that she has a different posture when walking with this and tends to be looking down and thinks this is contributing.  She has no fever or nuchal rigidity to suggest that this is meningitis.  Labs show a slightly decreased potassium otherwise normal labs, which were repleted potassium advised patient on oral intake.  She has not been eating at home for pain, given lidocaine  patch and Tylenol  here.  Will plan to discharge home with prescription for diclofenac  gel, have her take Tylenol  which she states she has at home and follow-up with her PCP. I ordered medication as above for her neck pain Reevaluation of the patient after these medicines showed that the patient improved I have reviewed the patients home medicines and have made adjustments as needed    Test / Admission - Considered:  No indication for admission, will discharge patient home    Amount and/or Complexity of Data Reviewed Labs: ordered. Radiology: ordered.  Risk OTC drugs. Prescription drug management.        Final diagnoses:  None    ED Discharge Orders     None          Suellen Sherran DELENA DEVONNA 09/30/23 1642    Suzette Pac, MD 10/03/23 [phone]

## 2023-09-30 NOTE — ED Triage Notes (Addendum)
 Pt reports headache,chills x5-6 days. Reports pain comes and goes and is worse when head/neck moves. Has taken meloxicam with no change in pain. Reports light and sound sensitivity.

## 2023-09-30 NOTE — Discharge Instructions (Addendum)
 You are seen in the ER today for neck pain fortunately your CT scan of your head and neck were reassuring.  Your symptoms are likely due to muscle strain. Take your Tylenol  that you have at home as directed on the packaging and can use the lidocaine  patches for 12 hours, when you have the patch off you can use the diclofenac  gel.  Come back to the ER for new or worsening symptoms.  Your potassium was somewhat low as well today, we gave you potassium by mouth.  Eat diet high in potassium such as bananas, orange juice, milk, potatoes, cantaloupe.

## 2023-09-30 NOTE — ED Triage Notes (Signed)
 Patient reports worsening neck pain with movement and reports bilateral pain in neck that shoots into the front of her head.  Was seen at [facility] on 7/9 ofr the same thing.

## 2024-01-01 ENCOUNTER — Emergency Department (HOSPITAL_COMMUNITY)

## 2024-01-01 ENCOUNTER — Encounter (HOSPITAL_COMMUNITY): Payer: Self-pay

## 2024-01-01 ENCOUNTER — Emergency Department (HOSPITAL_COMMUNITY)
Admission: EM | Admit: 2024-01-01 | Discharge: 2024-01-02 | Disposition: A | Discharge status: Home / Self Care | Attending: Emergency Medicine | Admitting: Emergency Medicine

## 2024-01-01 DIAGNOSIS — S32592A Other specified fracture of left pubis, initial encounter for closed fracture: Secondary | ICD-10-CM | POA: Diagnosis not present

## 2024-01-01 DIAGNOSIS — Y92480 Sidewalk as the place of occurrence of the external cause: Secondary | ICD-10-CM | POA: Diagnosis not present

## 2024-01-01 DIAGNOSIS — R1030 Lower abdominal pain, unspecified: Secondary | ICD-10-CM | POA: Diagnosis present

## 2024-01-01 DIAGNOSIS — S32502A Unspecified fracture of left pubis, initial encounter for closed fracture: Secondary | ICD-10-CM | POA: Diagnosis not present

## 2024-01-01 DIAGNOSIS — S32512A Fracture of superior rim of left pubis, initial encounter for closed fracture: Secondary | ICD-10-CM | POA: Diagnosis not present

## 2024-01-01 DIAGNOSIS — Z91040 Latex allergy status: Secondary | ICD-10-CM | POA: Diagnosis not present

## 2024-01-01 DIAGNOSIS — W010XXA Fall on same level from slipping, tripping and stumbling without subsequent striking against object, initial encounter: Secondary | ICD-10-CM | POA: Diagnosis not present

## 2024-01-01 DIAGNOSIS — W19XXXA Unspecified fall, initial encounter: Secondary | ICD-10-CM | POA: Diagnosis not present

## 2024-01-01 NOTE — ED Triage Notes (Signed)
 Pt comes in after a fall this afternoon. Pt fell off a sidewalk. Pt has left groin area and left hip area. Pt can bear weight but cannot walk with it. Pt is A&Ox4.   Pt denies hitting her head.

## 2024-01-02 ENCOUNTER — Telehealth: Payer: Self-pay

## 2024-01-02 DIAGNOSIS — S32592A Other specified fracture of left pubis, initial encounter for closed fracture: Secondary | ICD-10-CM | POA: Diagnosis not present

## 2024-01-02 MED ORDER — HYDROCODONE-ACETAMINOPHEN 5-325 MG PO TABS: 1.0000 | ORAL_TABLET | Freq: Four times a day (QID) | ORAL | 0 refills | Status: DC | PRN

## 2024-01-02 MED ORDER — HYDROCODONE-ACETAMINOPHEN 5-325 MG PO TABS
1.0000 | ORAL_TABLET | Freq: Once | ORAL | Status: AC
  Administered 2024-01-02: 1 via ORAL
  Filled 2024-01-02: qty 1

## 2024-01-02 NOTE — ED Provider Notes (Signed)
 [place] EMERGENCY DEPARTMENT AT [facility] Provider Note   CSN: [id number] Arrival date & time: 01/01/24  2216     Patient presents with: Fall (Left hip pain)   Tracy Townsend is a 83 y.o. female.   The history is provided by the patient and a relative.   Patient presents after accidental fall.  This occurred yesterday afternoon.  When she stepped off a sidewalk she lost her balance and fell down to the ground.  She reports pain in her left groin.  No head injury or LOC.  She is not on anticoagulation.  No neck or back pain.  No chest or abdominal pain.  She did ambulate at home to wash her hair and go to the restroom.  However she reports it hurts to walk    Prior to Admission medications   Medication Sig Start Date End Date Taking? Authorizing Provider  HYDROcodone -acetaminophen  (NORCO/VICODIN) 5-325 MG tablet Take 1 tablet by mouth every 6 (six) hours as needed for severe pain (pain score 7-10). 01/02/24  Yes Midge Golas, MD  acetaminophen  (TYLENOL ) 325 MG tablet Take 650 mg by mouth every 6 (six) hours as needed.    [provider]  calcitonin, salmon, (MIACALCIN/FORTICAL) 200 UNIT/ACT nasal spray Place 1 spray into alternate nostrils daily. 12/02/17   [provider]  cholecalciferol (VITAMIN D3) 25 MCG (1000 UNIT) tablet Take 1,000 Units by mouth daily.    [provider]  diclofenac  Sodium (VOLTAREN ) 1 % GEL Apply 2 g topically 4 (four) times daily. 09/30/23   Suellen Cantor A, PA-C  ibuprofen (ADVIL) 200 MG tablet Take 200 mg by mouth every 6 (six) hours as needed.    [provider]  lidocaine  (LIDODERM ) 5 % Place 1 patch onto the skin daily. Remove & Discard patch within 12 hours or as directed by MD 09/30/23   Suellen Cantor A, PA-C  losartan (COZAAR) 100 MG tablet Take 100 mg by mouth at bedtime.  11/24/17   [provider]  Multiple Vitamin (MULTIVITAMIN WITH MINERALS) TABS tablet Take 1 tablet by mouth daily.     [provider]  pantoprazole  (PROTONIX ) 40 MG tablet Take 1 tablet (40 mg total) by mouth daily before breakfast. 02/04/21   Rehman, Claudis PENNER, MD  pravastatin (PRAVACHOL) 20 MG tablet Take 20 mg by mouth at bedtime. 11/30/19   [provider]  vitamin B-12 (CYANOCOBALAMIN ) 500 MCG tablet Take 500 mcg by mouth daily.     [provider]    Allergies: Morphine  and codeine and Latex    Review of Systems  Musculoskeletal:  Positive for arthralgias. Negative for back pain and neck pain.  Neurological:  Negative for headaches.    Updated Vital Signs BP (!) 166/95 (BP Location: Right Arm)   Pulse 86   Temp 98.6 F (37 C) (Oral)   Resp 20   Ht 1.524 m (5')   Wt 64.9 kg   SpO2 96%   BMI 27.94 kg/m   Physical Exam CONSTITUTIONAL: Elderly but no acute distress HEAD: Normocephalic/atraumatic, no visible trauma EYES: EOMI/PERRL ENMT: Mucous membranes moist NECK: supple no meningeal signs SPINE/BACK:entire spine nontender No bruising/crepitance/stepoffs noted to spine CV: S1/S2 noted, no murmurs/rubs/gallops noted LUNGS: Lungs are clear to auscultation bilaterally, no apparent distress ABDOMEN: soft, nontender NEURO: Pt is awake/alert/appropriate, moves all extremitiesx4.  No facial droop.   EXTREMITIES: pulses normal/equal, full ROM Pelvis is stable. Tenderness with range of motion of left hip.  There is no deformities  All other extremities/joints palpated/ranged and nontender SKIN: warm, color normal PSYCH: no abnormalities of mood noted, alert and oriented to situation  (all labs ordered are listed, but only abnormal results are displayed) Labs Reviewed - No data to display  EKG: None  Radiology: DG Hip Unilat W or Wo Pelvis 2-3 Views Left Result Date: 01/01/2024 CLINICAL DATA:  Recent fall with hip pain, initial encounter EXAM: DG HIP (WITH OR WITHOUT PELVIS) 3V LEFT COMPARISON:  None Available. FINDINGS: Proximal femur is within normal limits.  Mildly displaced superior and inferior left pubic rami fractures are noted. No other focal abnormality is seen. No soft tissue changes are noted. IMPRESSION: Left superior and inferior pubic rami fractures. Electronically Signed   By: Oneil Devonshire M.D.   On: 01/01/2024 22:48     Procedures   Medications Ordered in the ED  HYDROcodone -acetaminophen  (NORCO/VICODIN) 5-325 MG per tablet 1 tablet (1 tablet Oral Given 01/02/24 0206)    Clinical Course as of 01/02/24 0254  Sat Jan 02, 2024  0252 Patient presents after accidental mechanical fall triggering a pubic ramus fracture.  It is stable.  She had already been ambulating prior to arrival.  Her pelvis is stable.  After 1 dose of Vicodin here, patient is able to stand up and ambulate.  She has a walker at home.  We discussed the risk and benefits of admission versus discharge.  I advised that this is weight-bear as tolerated, and pain management.  Patient would prefer to go home.  She has close family support, and I will also place orders for home health.  She has no signs of hip fracture.  She is feeling improved.  She has no other signs of acute traumatic injury [DW]    Clinical Course User Index [DW] Midge Golas, MD           Glasgow Coma Scale Score: 15      NEXUS Criteria Score: 0                Medical Decision Making Amount and/or Complexity of Data Reviewed Radiology: ordered.  Risk Prescription drug management.   Patient was found to have pubic ramus fracture.  I personally evaluated the images and confirmed the findings.  Patient had good pain relief with 1 dose of Vicodin here.  She has no other signs of acute traumatic injuries. There is no signs of any hip fractures.  She has no signs of any spinal injury. Home health has been ordered.  Weight-bear as tolerated, and she has a walker.  Referred to orthopedics     Final diagnoses:  Closed fracture of ramus of left pubis, initial encounter [facility])    ED Discharge  Orders          Ordered    HYDROcodone -acetaminophen  (NORCO/VICODIN) 5-325 MG tablet  Every 6 hours PRN        01/02/24 0251               Midge Golas, MD 01/02/24 0255

## 2024-01-02 NOTE — ED Notes (Signed)
 ED Provider at bedside.

## 2024-01-02 NOTE — Telephone Encounter (Signed)
 Patient was seen last night for pubic rami fracture. Home health was ordered. Patient was accepted by [place]. They will call the paitent to schedule

## 2024-01-02 NOTE — ED Notes (Signed)
 Pt up to walker and ambulated to wheelchair independently. Pt tolerated well and states she feels comfortable going home.

## 2024-01-04 ENCOUNTER — Telehealth: Payer: Self-pay | Admitting: Orthopedic Surgery

## 2024-01-04 NOTE — Telephone Encounter (Signed)
 This patient was seen in the ED on 01/01/24 for a fx lt pubic ramus.  You currently have one opening on 01/06/24 at 8:45am that is a f/u slot, this pt will be a NP.  You also have a same day opening on 01/08/24.  Can she wait until 01/08/24, if not, can I use the opening on 10/15 for her?  [phone]

## 2024-01-05 ENCOUNTER — Ambulatory Visit (INDEPENDENT_AMBULATORY_CARE_PROVIDER_SITE_OTHER): Admitting: Orthopedic Surgery

## 2024-01-05 ENCOUNTER — Telehealth: Payer: Self-pay | Admitting: Orthopedic Surgery

## 2024-01-05 ENCOUNTER — Encounter: Payer: Self-pay | Admitting: Orthopedic Surgery

## 2024-01-05 VITALS — BP 116/80 | HR 80 | Ht 60.0 in | Wt 143.0 lb

## 2024-01-05 DIAGNOSIS — S32592A Other specified fracture of left pubis, initial encounter for closed fracture: Secondary | ICD-10-CM | POA: Diagnosis not present

## 2024-01-05 DIAGNOSIS — S32512A Fracture of superior rim of left pubis, initial encounter for closed fracture: Secondary | ICD-10-CM

## 2024-01-05 NOTE — Progress Notes (Signed)
 New Patient Visit  Assessment: Tracy Townsend is a 83 y.o. female with the following: 1. Closed fracture of superior ramus of left pubis, initial encounter (HCC) 2. Closed fracture of left inferior pubic ramus, initial encounter (HCC)  Plan: Tracy Townsend fell and sustained a minimally displaced fractures of the left superior and inferior rami.  Discussed the nature of the injury.  Recommended weightbearing as tolerated using a walker.  Anticipate gradual resolution of her symptoms.  Continue Tylenol  as needed.  Consider home health physical therapy, but recommend she remain active.  This was discussed with the patient and her daughter in clinic today.  She will follow-up in 2 weeks with repeat x-rays.  Follow-up: Return in about 2 weeks (around 01/19/2024).  Subjective:  Chief Complaint  Patient presents with   Hip Injury    Fracture left pubic ramus 01/01/24    History of Present Illness: Tracy Townsend is a 83 y.o. female who presents for evaluation of left hip pain.  A few days ago, she stepped off a curb and lost her balance.  She landed on her left side, and had immediate pain.  She presented to the emergency department, radiographs demonstrated superior and inferior pubic rami fractures on the left.  She was able to return home.  Since returning home, she has remained active.  She is able to stand, take a few steps.  She does continue to have pain in the left groin.  She is using canes, as well as a walker to assist with ambulation.  Tylenol  has been effective.  Hydrocodone  caused nausea.   Review of Systems: No fevers or chills No numbness or tingling No chest pain No shortness of breath No bowel or bladder dysfunction No GI distress No headaches   Medical History:  Past Medical History:  Diagnosis Date   Hyperlipemia    Hypertension     Past Surgical History:  Procedure Laterality Date   ABDOMINAL HYSTERECTOMY     APPENDECTOMY     bladder surg      CARDIAC CATHETERIZATION     CHOLECYSTECTOMY     COLONOSCOPY N/A 09/07/2014   Procedure: COLONOSCOPY;  Surgeon: Claudis RAYMOND Rivet, MD;  Location: [facility];  Service: Endoscopy;  Laterality: N/A;  730   COLONOSCOPY WITH PROPOFOL  N/A 02/10/2020   Procedure: COLONOSCOPY WITH PROPOFOL ;  Surgeon: Eartha Angelia Sieving, MD;  Location: [facility];  Service: Gastroenterology;  Laterality: N/A;  145   HEMORRHOID SURGERY     KNEE SURGERY     POLYPECTOMY  02/10/2020   Procedure: POLYPECTOMY;  Surgeon: Eartha Angelia, Sieving, MD;  Location: [facility];  Service: Gastroenterology;;    Family History  Problem Relation Age of Onset   Skin cancer Mother    Skin cancer Father    Prostate cancer Brother    Healthy Brother    Colon cancer Maternal Grandmother    Social History   Tobacco Use   Smoking status: Never   Smokeless tobacco: Never  Vaping Use   Vaping status: Never Used  Substance Use Topics   Alcohol use: No   Drug use: No    Allergies  Allergen Reactions   Morphine  And Codeine Nausea And Vomiting and Nausea Only   Latex Rash    Current Meds  Medication Sig   acetaminophen  (TYLENOL ) 325 MG tablet Take 650 mg by mouth every 6 (six) hours as needed.   calcitonin, salmon, (MIACALCIN/FORTICAL) 200 UNIT/ACT nasal spray Place 1 spray into  alternate nostrils daily.   cholecalciferol (VITAMIN D3) 25 MCG (1000 UNIT) tablet Take 1,000 Units by mouth daily.   diclofenac  Sodium (VOLTAREN ) 1 % GEL Apply 2 g topically 4 (four) times daily.   HYDROcodone -acetaminophen  (NORCO/VICODIN) 5-325 MG tablet Take 1 tablet by mouth every 6 (six) hours as needed for severe pain (pain score 7-10).   ibuprofen (ADVIL) 200 MG tablet Take 200 mg by mouth every 6 (six) hours as needed.   lidocaine  (LIDODERM ) 5 % Place 1 patch onto the skin daily. Remove & Discard patch within 12 hours or as directed by MD   losartan (COZAAR) 100 MG tablet Take 100 mg by mouth at bedtime.    Multiple Vitamin  (MULTIVITAMIN WITH MINERALS) TABS tablet Take 1 tablet by mouth daily.   pantoprazole  (PROTONIX ) 40 MG tablet Take 1 tablet (40 mg total) by mouth daily before breakfast.   pravastatin (PRAVACHOL) 20 MG tablet Take 20 mg by mouth at bedtime.   vitamin B-12 (CYANOCOBALAMIN ) 500 MCG tablet Take 500 mcg by mouth daily.     Objective: BP 116/80   Pulse 80   Ht 5' (1.524 m)   Wt 143 lb (64.9 kg)   BMI 27.93 kg/m   Physical Exam:  General: Elderly female. and Seated in a wheelchair. Gait: Unable to ambulate.  Bruising and swelling about the left hip.  She tolerates gentle range of motion of the hip.  She is able to maintain a straight leg raise.  No pain with axial loading.  Toes are warm and well-perfused.  Sensation is intact to the left foot.  IMAGING: I personally reviewed images previously obtained from the ED  X-rays of the pelvis were available in clinic today.  Minimally displaced superior and inferior pubic rami fractures   New Medications:  No orders of the defined types were placed in this encounter.     Tracy DELENA Horde, MD  01/05/2024 9:37 AM

## 2024-01-05 NOTE — Telephone Encounter (Signed)
 Dr. Onesimo pt - spoke w/Chris PT w/[facility] [phone], he stated the pt is declining PT stating she doesn't need it.

## 2024-01-13 ENCOUNTER — Encounter: Payer: Self-pay | Admitting: Physician Assistant

## 2024-01-13 ENCOUNTER — Ambulatory Visit: Admitting: Physician Assistant

## 2024-01-13 VITALS — BP 152/93 | HR 72 | Temp 97.5°F | Ht 61.0 in | Wt 119.8 lb

## 2024-01-13 DIAGNOSIS — S3282XD Multiple fractures of pelvis without disruption of pelvic ring, subsequent encounter for fracture with routine healing: Secondary | ICD-10-CM | POA: Diagnosis not present

## 2024-01-13 DIAGNOSIS — Z7689 Persons encountering health services in other specified circumstances: Secondary | ICD-10-CM

## 2024-01-13 DIAGNOSIS — M810 Age-related osteoporosis without current pathological fracture: Secondary | ICD-10-CM | POA: Insufficient documentation

## 2024-01-13 DIAGNOSIS — E782 Mixed hyperlipidemia: Secondary | ICD-10-CM | POA: Diagnosis not present

## 2024-01-13 DIAGNOSIS — I10 Essential (primary) hypertension: Secondary | ICD-10-CM

## 2024-01-13 DIAGNOSIS — S329XXA Fracture of unspecified parts of lumbosacral spine and pelvis, initial encounter for closed fracture: Secondary | ICD-10-CM | POA: Insufficient documentation

## 2024-01-13 NOTE — Assessment & Plan Note (Signed)
 Stable. No longer taking cholesterol medication. Continue with current management. Discussed healthy diet and lifestyle. Lipid panel today.

## 2024-01-13 NOTE — Assessment & Plan Note (Signed)
 146/86 Elevated, nurse visit in 1 month for BP check. Continue current medications. No change in management. Discussed DASH diet and dietary sodium restrictions.  Follow up sooner for new chest pain, shortness of breath, headache, or visual changes.

## 2024-01-13 NOTE — Progress Notes (Signed)
 New Patient Office Visit  Subjective    Patient ID: Tracy Townsend, female    DOB: [birth year]  Age: 83 y.o. MRN: [id number]  CC:  Chief Complaint  Patient presents with   Hip Injury    Pt. Broke hip October 10th. Pt. Gets around good with crane and walker as stated. Still in some pain as mentioned.     HPI Tracy Townsend presents to establish care  Discussed the use of AI scribe software for clinical note transcription with the patient, who gave verbal consent to proceed.  History of Present Illness Tracy Townsend is an 83 year old female with osteoporosis who presents to establish care and follow-up after a recent pelvic fracture.  She sustained a pelvic fracture two weeks ago after stepping off a curb and falling, hearing a 'pop' on her left side. She can get up and sit down independently but has difficulty bearing weight on the affected side, requiring her to scoot or walk backwards. She is under the care of an orthopedist with a follow-up scheduled.  Arthritis, particularly in her knees, contributes to her mobility challenges. She uses a cane or walker for ambulation. Her arthritis is longstanding and affects multiple joints.  Her medical history includes hypertension, hyperlipidemia, and osteoporosis. She takes losartan, vitamin D, and calcium. She prefers Tylenol  for pain management due to nausea from prescribed pain medications.  She has not received flu, shingles, or pneumonia vaccines. She monitors her blood pressure at home, noting it is slightly elevated, and takes her medication at night. Her last primary care visit was in March 2025 with normal blood work, and additional normal blood work was done in July 2025 during an ER visit. No new chest pain or shortness of breath. She elevates her legs when sitting.    Outpatient Encounter Medications as of 01/13/2024  Medication Sig   acetaminophen  (TYLENOL ) 325 MG tablet Take 650 mg by mouth every 6 (six) hours as  needed.   cholecalciferol (VITAMIN D3) 25 MCG (1000 UNIT) tablet Take 1,000 Units by mouth daily.   losartan (COZAAR) 100 MG tablet Take 100 mg by mouth at bedtime.    Multiple Vitamin (MULTIVITAMIN WITH MINERALS) TABS tablet Take 1 tablet by mouth daily.   vitamin B-12 (CYANOCOBALAMIN ) 500 MCG tablet Take 500 mcg by mouth daily.    [DISCONTINUED] calcitonin, salmon, (MIACALCIN/FORTICAL) 200 UNIT/ACT nasal spray Place 1 spray into alternate nostrils daily.   [DISCONTINUED] diclofenac  Sodium (VOLTAREN ) 1 % GEL Apply 2 g topically 4 (four) times daily.   [DISCONTINUED] HYDROcodone -acetaminophen  (NORCO/VICODIN) 5-325 MG tablet Take 1 tablet by mouth every 6 (six) hours as needed for severe pain (pain score 7-10).   [DISCONTINUED] ibuprofen (ADVIL) 200 MG tablet Take 200 mg by mouth every 6 (six) hours as needed.   [DISCONTINUED] lidocaine  (LIDODERM ) 5 % Place 1 patch onto the skin daily. Remove & Discard patch within 12 hours or as directed by MD   [DISCONTINUED] pantoprazole  (PROTONIX ) 40 MG tablet Take 1 tablet (40 mg total) by mouth daily before breakfast.   [DISCONTINUED] pravastatin (PRAVACHOL) 20 MG tablet Take 20 mg by mouth at bedtime.   No facility-administered encounter medications on file as of 01/13/2024.    Past Medical History:  Diagnosis Date   Hyperlipemia    Hypertension     Past Surgical History:  Procedure Laterality Date   ABDOMINAL HYSTERECTOMY     APPENDECTOMY     bladder surg     CARDIAC CATHETERIZATION  CHOLECYSTECTOMY     COLONOSCOPY N/A 09/07/2014   Procedure: COLONOSCOPY;  Surgeon: Claudis RAYMOND Rivet, MD;  Location: [facility];  Service: Endoscopy;  Laterality: N/A;  730   COLONOSCOPY WITH PROPOFOL  N/A 02/10/2020   Procedure: COLONOSCOPY WITH PROPOFOL ;  Surgeon: Eartha Angelia Sieving, MD;  Location: [facility];  Service: Gastroenterology;  Laterality: N/A;  145   HEMORRHOID SURGERY     KNEE SURGERY     POLYPECTOMY  02/10/2020   Procedure:  POLYPECTOMY;  Surgeon: Eartha Angelia Sieving, MD;  Location: [facility];  Service: Gastroenterology;;    Family History  Problem Relation Age of Onset   Skin cancer Mother    Skin cancer Father    Prostate cancer Brother    Healthy Brother    Colon cancer Maternal Grandmother     Social History   Socioeconomic History   Marital status: Widowed    Spouse name: Not on file   Number of children: Not on file   Years of education: Not on file   Highest education level: Not on file  Occupational History   Not on file  Tobacco Use   Smoking status: Never   Smokeless tobacco: Never  Vaping Use   Vaping status: Never Used  Substance and Sexual Activity   Alcohol use: No   Drug use: No   Sexual activity: Not on file  Other Topics Concern   Not on file  Social History Narrative   Not on file   Social Drivers of Health   Financial Resource Strain: Not on file  Food Insecurity: Not on file  Transportation Needs: Not on file  Physical Activity: Not on file  Stress: Not on file  Social Connections: Not on file  Intimate Partner Violence: Not on file    Review of Systems  Constitutional:  Negative for fever and malaise/fatigue.  Eyes:  Negative for blurred vision and double vision.  Respiratory:  Negative for cough and shortness of breath.   Cardiovascular:  Negative for chest pain, palpitations and leg swelling.  Musculoskeletal:  Positive for falls and joint pain.  Neurological:  Negative for headaches.        Objective    BP (!) 152/93   Pulse 72   Temp (!) 97.5 F (36.4 C)   Ht 5' 1 (1.549 m)   Wt 119 lb 12 oz (54.3 kg)   BMI 22.63 kg/m   Physical Exam Constitutional:      General: She is not in acute distress.    Appearance: Normal appearance. She is normal weight. She is not ill-appearing.  HENT:     Head: Normocephalic and atraumatic.     Mouth/Throat:     Mouth: Mucous membranes are moist.     Pharynx: Oropharynx is clear.  Eyes:      Extraocular Movements: Extraocular movements intact.     Conjunctiva/sclera: Conjunctivae normal.  Cardiovascular:     Rate and Rhythm: Normal rate and regular rhythm.     Heart sounds: Normal heart sounds. No murmur heard. Pulmonary:     Effort: Pulmonary effort is normal.     Breath sounds: Normal breath sounds.  Musculoskeletal:     Right lower leg: No edema.     Left lower leg: No edema.  Skin:    General: Skin is warm and dry.  Neurological:     General: No focal deficit present.     Mental Status: She is alert and oriented to person, place, and time.  Psychiatric:        Mood and Affect: Mood normal.        Behavior: Behavior normal.       Assessment & Plan:  Encounter to establish care  Primary hypertension Assessment & Plan: 146/86 Elevated, nurse visit in 1 month for BP check. Continue current medications. No change in management. Discussed DASH diet and dietary sodium restrictions.  Follow up sooner for new chest pain, shortness of breath, headache, or visual changes.     Mixed hyperlipidemia Assessment & Plan: Stable. No longer taking cholesterol medication. Continue with current management. Discussed healthy diet and lifestyle. Lipid panel today.   Orders: -     Lipid panel  Multiple closed fractures of pelvis without disruption of pelvic ring with routine healing, subsequent encounter Assessment & Plan: Healing pelvic fracture on the left side without surgical intervention. Undergoing therapy with better-than-expected movement. - Continue physical therapy to maintain leg function. - Follow up with orthopedist Dr. Arloa on January 19, 2024. - Continue tylenol  for pain management. Okay to take prescription pain medication as needed for severe pain.       Return in about 6 months (around 07/13/2024) for BP.   Charmaine Denis Carreon, PA-C

## 2024-01-13 NOTE — Assessment & Plan Note (Signed)
 Healing pelvic fracture on the left side without surgical intervention. Undergoing therapy with better-than-expected movement. - Continue physical therapy to maintain leg function. - Follow up with orthopedist Dr. Arloa on January 19, 2024. - Continue tylenol  for pain management. Okay to take prescription pain medication as needed for severe pain.

## 2024-01-14 ENCOUNTER — Ambulatory Visit: Payer: Self-pay | Admitting: Physician Assistant

## 2024-01-14 LAB — LIPID PANEL
Chol/HDL Ratio: 3.3 ratio (ref 0.0–4.4)
Cholesterol, Total: 183 mg/dL (ref 100–199)
HDL: 56 mg/dL (ref >39)
LDL Chol Calc (NIH): 111 mg/dL — ABNORMAL HIGH (ref 0–99)
Triglycerides: 85 mg/dL (ref 0–149)
VLDL Cholesterol Cal: 16 mg/dL (ref 5–40)

## 2024-01-18 ENCOUNTER — Emergency Department (HOSPITAL_COMMUNITY)
Admission: EM | Admit: 2024-01-18 | Discharge: 2024-01-18 | Disposition: A | Discharge status: Home / Self Care | Attending: Emergency Medicine | Admitting: Emergency Medicine

## 2024-01-18 ENCOUNTER — Encounter (HOSPITAL_COMMUNITY): Payer: Self-pay

## 2024-01-18 ENCOUNTER — Other Ambulatory Visit: Payer: Self-pay

## 2024-01-18 ENCOUNTER — Telehealth: Payer: Self-pay | Admitting: Orthopedic Surgery

## 2024-01-18 DIAGNOSIS — S32592D Other specified fracture of left pubis, subsequent encounter for fracture with routine healing: Secondary | ICD-10-CM | POA: Insufficient documentation

## 2024-01-18 DIAGNOSIS — R638 Other symptoms and signs concerning food and fluid intake: Secondary | ICD-10-CM | POA: Insufficient documentation

## 2024-01-18 DIAGNOSIS — M25461 Effusion, right knee: Secondary | ICD-10-CM | POA: Diagnosis not present

## 2024-01-18 DIAGNOSIS — M6281 Muscle weakness (generalized): Secondary | ICD-10-CM | POA: Diagnosis not present

## 2024-01-18 DIAGNOSIS — G8929 Other chronic pain: Secondary | ICD-10-CM | POA: Insufficient documentation

## 2024-01-18 DIAGNOSIS — M170 Bilateral primary osteoarthritis of knee: Secondary | ICD-10-CM | POA: Diagnosis not present

## 2024-01-18 DIAGNOSIS — Z79899 Other long term (current) drug therapy: Secondary | ICD-10-CM | POA: Insufficient documentation

## 2024-01-18 DIAGNOSIS — W19XXXD Unspecified fall, subsequent encounter: Secondary | ICD-10-CM | POA: Diagnosis not present

## 2024-01-18 DIAGNOSIS — X58XXXD Exposure to other specified factors, subsequent encounter: Secondary | ICD-10-CM | POA: Insufficient documentation

## 2024-01-18 DIAGNOSIS — S32512D Fracture of superior rim of left pubis, subsequent encounter for fracture with routine healing: Secondary | ICD-10-CM | POA: Diagnosis not present

## 2024-01-18 DIAGNOSIS — M1712 Unilateral primary osteoarthritis, left knee: Secondary | ICD-10-CM | POA: Diagnosis not present

## 2024-01-18 DIAGNOSIS — R531 Weakness: Secondary | ICD-10-CM

## 2024-01-18 DIAGNOSIS — M25462 Effusion, left knee: Secondary | ICD-10-CM | POA: Diagnosis not present

## 2024-01-18 LAB — URINALYSIS, ROUTINE W REFLEX MICROSCOPIC
Bilirubin Urine: NEGATIVE
Glucose, UA: NEGATIVE mg/dL
Ketones, ur: 5 mg/dL — AB
Nitrite: NEGATIVE
Protein, ur: NEGATIVE mg/dL
Specific Gravity, Urine: 1.005 (ref 1.005–1.030)
pH: 6 (ref 5.0–8.0)

## 2024-01-18 LAB — CBC
HCT: 40.9 % (ref 36.0–46.0)
Hemoglobin: 13 g/dL (ref 12.0–15.0)
MCH: 27.7 pg (ref 26.0–34.0)
MCHC: 31.8 g/dL (ref 30.0–36.0)
MCV: 87 fL (ref 80.0–100.0)
Platelets: 385 K/uL (ref 150–400)
RBC: 4.7 MIL/uL (ref 3.87–5.11)
RDW: 14.2 % (ref 11.5–15.5)
WBC: 11.4 K/uL — ABNORMAL HIGH (ref 4.0–10.5)
nRBC: 0 % (ref 0.0–0.2)

## 2024-01-18 LAB — COMPREHENSIVE METABOLIC PANEL WITH GFR
ALT: 9 U/L (ref 0–44)
AST: 16 U/L (ref 15–41)
Albumin: 3.4 g/dL — ABNORMAL LOW (ref 3.5–5.0)
Alkaline Phosphatase: 202 U/L — ABNORMAL HIGH (ref 38–126)
Anion gap: 13 (ref 5–15)
BUN: 18 mg/dL (ref 8–23)
CO2: 23 mmol/L (ref 22–32)
Calcium: 9.2 mg/dL (ref 8.9–10.3)
Chloride: 99 mmol/L (ref 98–111)
Creatinine, Ser: 0.82 mg/dL (ref 0.44–1.00)
GFR, Estimated: >60 mL/min (ref >60)
Glucose, Bld: 98 mg/dL (ref 70–99)
Potassium: 3.8 mmol/L (ref 3.5–5.1)
Sodium: 135 mmol/L (ref 135–145)
Total Bilirubin: 0.7 mg/dL (ref 0.0–1.2)
Total Protein: 7.4 g/dL (ref 6.5–8.1)

## 2024-01-18 MED ORDER — TRAMADOL HCL 50 MG PO TABS: 50.0000 mg | ORAL_TABLET | Freq: Four times a day (QID) | ORAL | 0 refills | Status: DC | PRN

## 2024-01-18 MED ORDER — TRIAMCINOLONE ACETONIDE 40 MG/ML IJ SUSP
40.0000 mg | Freq: Once | INTRAMUSCULAR | Status: AC
  Administered 2024-01-18: 40 mg via INTRAMUSCULAR
  Filled 2024-01-18: qty 1

## 2024-01-18 MED ORDER — MORPHINE SULFATE (PF) 2 MG/ML IV SOLN
2.0000 mg | Freq: Once | INTRAVENOUS | Status: AC
  Administered 2024-01-18: 2 mg via INTRAVENOUS
  Filled 2024-01-18: qty 1

## 2024-01-18 MED ORDER — ONDANSETRON HCL 4 MG/2ML IJ SOLN
4.0000 mg | Freq: Once | INTRAMUSCULAR | Status: AC
  Administered 2024-01-18: 4 mg via INTRAVENOUS
  Filled 2024-01-18: qty 2

## 2024-01-18 MED ORDER — TRIAMCINOLONE ACETONIDE 40 MG/ML IJ SUSP
40.0000 mg | Freq: Once | INTRAMUSCULAR | Status: AC
  Administered 2024-01-18: 40 mg via INTRA_ARTICULAR
  Filled 2024-01-18: qty 1

## 2024-01-18 MED ORDER — LACTATED RINGERS IV BOLUS
1000.0000 mL | Freq: Once | INTRAVENOUS | Status: AC
  Administered 2024-01-18: 1000 mL via INTRAVENOUS

## 2024-01-18 NOTE — ED Triage Notes (Signed)
 Pt arrived via POV from home with family who reports Pt has bilateral knee pain, arthritis, and is currently recovering from a pelvis fracture and reports Pt has been having decreased oral intake, and her urine has been very dark.

## 2024-01-18 NOTE — Telephone Encounter (Signed)
 This patient lvm to cx her appt tomorrow.  I called her back to rs her and she declined to rs at all.  FYI to you since she has a fx.

## 2024-01-18 NOTE — ED Notes (Signed)
 Spoke with pharmacy who states they are bringing the Kenalog injection shortly.

## 2024-01-18 NOTE — Discharge Instructions (Addendum)
 It was our pleasure to provide your ER care today - we hope that you feel better.  Drink plenty of fluids/stay well hydrated. Get adequate nutrition.   For knee pain, follow up closely with your orthopedist in the coming week - call office to arrange appointment.  You may take acetaminophen  and/or ibuprofen as need. You may also take ultram as need for pain - no driving when taking.   For other issues and routine care, follow up closely with primary care doctor. A urine culture was sent today the results of which are pending, and should be available in 1-2 days time - you may check MyChart for results and/or have primary care doctor follow up on results in the next two days.   Return to ER if worse, new symptoms, fevers, severe/intractable pain, chest pain, trouble breathing, weak/fainting, abdominal pain, persistent vomiting, or other concern.

## 2024-01-18 NOTE — ED Provider Notes (Signed)
 [facility] AT [facility] Provider Note   CSN: [id number] Arrival date & time: 01/18/24  1007     Patient presents with: Dehydration   Tracy Townsend is a 83 y.o. female.   Patient c/o pain to bilateral knees, generally not feeling well, decreased po intake. Symptoms present in past week. Had recent fall with left pubic rami fractures, has been walking, but awkwardly, and feels that has caused her chronic bilateral knee pain (has been told 'bone on bone', with chronic, severe knee pain) to flare up. Denies headache. No sore throat, cough or uri symptoms. No chest pain or sob. No abd pain or nvd. No dysuria. No focal extremity swelling, redness. No fever or chills.   The history is provided by the patient, medical records and a relative.       Prior to Admission medications   Medication Sig Start Date End Date Taking? Authorizing Provider  acetaminophen  (TYLENOL ) 325 MG tablet Take 650 mg by mouth every 6 (six) hours as needed.    [provider]  cholecalciferol (VITAMIN D3) 25 MCG (1000 UNIT) tablet Take 1,000 Units by mouth daily.    [provider]  losartan (COZAAR) 100 MG tablet Take 100 mg by mouth at bedtime.  11/24/17   [provider]  Multiple Vitamin (MULTIVITAMIN WITH MINERALS) TABS tablet Take 1 tablet by mouth daily.    [provider]  vitamin B-12 (CYANOCOBALAMIN ) 500 MCG tablet Take 500 mcg by mouth daily.     [provider]    Allergies: Morphine  and codeine and Latex    Review of Systems  Constitutional:  Negative for chills and fever.  HENT:  Negative for sore throat.   Respiratory:  Negative for cough and shortness of breath.   Cardiovascular:  Negative for chest pain.  Gastrointestinal:  Negative for abdominal pain, diarrhea and vomiting.  Genitourinary:  Negative for dysuria and flank pain.  Musculoskeletal:  Negative for back pain and neck pain.  Skin:  Negative for rash.   Neurological:  Negative for headaches.    Updated Vital Signs BP (!) 145/75   Pulse 79   Temp 98.2 F (36.8 C) (Oral)   Resp 20   Ht 1.549 m (5' 1)   Wt 54.3 kg   SpO2 97%   BMI 22.62 kg/m   Physical Exam Vitals and nursing note reviewed.  Constitutional:      Appearance: Normal appearance. She is well-developed.  HENT:     Head: Atraumatic.     Nose: Nose normal.     Mouth/Throat:     Mouth: Mucous membranes are moist.     Pharynx: Oropharynx is clear.  Eyes:     General: No scleral icterus.    Conjunctiva/sclera: Conjunctivae normal.     Pupils: Pupils are equal, round, and reactive to light.  Neck:     Trachea: No tracheal deviation.     Comments: Trachea midline. Thyroid  not grossly enlarged or tender. No neck stiffness or rigidity.  Cardiovascular:     Rate and Rhythm: Normal rate and regular rhythm.     Pulses: Normal pulses.     Heart sounds: Normal heart sounds. No murmur heard.    No friction rub. No gallop.  Pulmonary:     Effort: Pulmonary effort is normal. No respiratory distress.     Breath sounds: Normal breath sounds.  Abdominal:     General: Bowel sounds are normal. There is no distension.  Palpations: Abdomen is soft. There is no mass.     Tenderness: There is no abdominal tenderness. There is no guarding.  Genitourinary:    Comments: No cva tenderness.  Musculoskeletal:        General: No tenderness.     Cervical back: Normal range of motion and neck supple. No rigidity. No muscular tenderness.     Comments: Mild bil lower leg/ankle edema, symmetric, chronic. W active rom knees, significant pain bil knees. No large effusion noted. No erythema or increased warmth to knees. Mild pain w passive rom left hip. No other focal bony tenderness on bil extremity exam. Distal pulses palp bil ext.   Skin:    General: Skin is warm and dry.     Findings: No rash.  Neurological:     Mental Status: She is alert.     Comments: Alert, speech normal.  Motor/sens grossly intact bil.  Psychiatric:        Mood and Affect: Mood normal.     (all labs ordered are listed, but only abnormal results are displayed) Results for orders placed or performed during the hospital encounter of 01/18/24  CBC   Collection Time: 01/18/24 10:51 AM  Result Value Ref Range   WBC 11.4 (H) 4.0 - 10.5 K/uL   RBC 4.70 3.87 - 5.11 MIL/uL   Hemoglobin 13.0 12.0 - 15.0 g/dL   HCT 59.0 63.9 - 53.9 %   MCV 87.0 80.0 - 100.0 fL   MCH 27.7 26.0 - 34.0 pg   MCHC 31.8 30.0 - 36.0 g/dL   RDW 85.7 88.4 - 84.4 %   Platelets 385 150 - 400 K/uL   nRBC 0.0 0.0 - 0.2 %  Comprehensive metabolic panel with GFR   Collection Time: 01/18/24 10:51 AM  Result Value Ref Range   Sodium 135 135 - 145 mmol/L   Potassium 3.8 3.5 - 5.1 mmol/L   Chloride 99 98 - 111 mmol/L   CO2 23 22 - 32 mmol/L   Glucose, Bld 98 70 - 99 mg/dL   BUN 18 8 - 23 mg/dL   Creatinine, Ser 9.17 0.44 - 1.00 mg/dL   Calcium 9.2 8.9 - 89.6 mg/dL   Total Protein 7.4 6.5 - 8.1 g/dL   Albumin 3.4 (L) 3.5 - 5.0 g/dL   AST 16 15 - 41 U/L   ALT 9 0 - 44 U/L   Alkaline Phosphatase 202 (H) 38 - 126 U/L   Total Bilirubin 0.7 0.0 - 1.2 mg/dL   GFR, Estimated >39 >39 mL/min   Anion gap 13 5 - 15  Urinalysis, Routine w reflex microscopic -Urine, Clean Catch   Collection Time: 01/18/24 12:23 PM  Result Value Ref Range   Color, Urine YELLOW YELLOW   APPearance CLEAR CLEAR   Specific Gravity, Urine 1.005 1.005 - 1.030   pH 6.0 5.0 - 8.0   Glucose, UA NEGATIVE NEGATIVE mg/dL   Hgb urine dipstick SMALL (A) NEGATIVE   Bilirubin Urine NEGATIVE NEGATIVE   Ketones, ur 5 (A) NEGATIVE mg/dL   Protein, ur NEGATIVE NEGATIVE mg/dL   Nitrite NEGATIVE NEGATIVE   Leukocytes,Ua TRACE (A) NEGATIVE   RBC / HPF 0-5 0 - 5 RBC/hpf   WBC, UA 0-5 0 - 5 WBC/hpf   Bacteria, UA MANY (A) NONE SEEN   Squamous Epithelial / HPF 0-5 0 - 5 /HPF   DG Hip Unilat W or Wo Pelvis 2-3 Views Left Result Date: 01/01/2024 CLINICAL DATA:   Recent fall with hip  pain, initial encounter EXAM: DG HIP (WITH OR WITHOUT PELVIS) 3V LEFT COMPARISON:  None Available. FINDINGS: Proximal femur is within normal limits. Mildly displaced superior and inferior left pubic rami fractures are noted. No other focal abnormality is seen. No soft tissue changes are noted. IMPRESSION: Left superior and inferior pubic rami fractures. Electronically Signed   By: Oneil Devonshire M.D.   On: 01/01/2024 22:48     EKG: None  Radiology: No results found.   Injection of joint  Date/Time: 01/18/2024 3:10 PM  Performed by: Bernard Drivers, MD Authorized by: Bernard Drivers, MD  Consent: Verbal consent obtained Patient identity confirmed: arm band Time out: Immediately prior to procedure a time out was called to verify the correct patient, procedure, equipment, support staff and site/side marked as required. Preparation: Patient was prepped and draped in the usual sterile fashion. Local anesthesia used: yes Anesthesia: local infiltration  Anesthesia: Local anesthesia used: yes Local Anesthetic: lidocaine  2% without epinephrine  Sedation: Patient sedated: no  Patient tolerance: patient tolerated the procedure well with no immediate complications Comments: Kenalog 40 mg w lidocaine  injected in each knee. Sterile technique. No complications. Sterile dressing. Pt tolerated well.       Medications Ordered in the ED  triamcinolone acetonide (KENALOG-40) injection 40 mg (has no administration in time range)  lactated ringers  bolus 1,000 mL (1,000 mLs Intravenous New Bag/Given 01/18/24 1126)  morphine  (PF) 2 MG/ML injection 2 mg (2 mg Intravenous Given 01/18/24 1134)  ondansetron  (ZOFRAN ) injection 4 mg (4 mg Intravenous Given 01/18/24 1132)                                    Medical Decision Making Problems Addressed: Bilateral primary osteoarthritis of knee: chronic illness or injury with exacerbation, progression, or side effects of treatment that  poses a threat to life or bodily functions Chronic pain of both knees: chronic illness or injury with exacerbation, progression, or side effects of treatment that poses a threat to life or bodily functions Generalized weakness: acute illness or injury with systemic symptoms Poor fluid intake: acute illness or injury with systemic symptoms Pubic ramus fracture, left, with routine healing, subsequent encounter: acute illness or injury  Amount and/or Complexity of Data Reviewed Independent Historian:     Details: Family, hx External Data Reviewed: notes. Labs: ordered. Decision-making details documented in ED Course. Radiology: independent interpretation performed. Decision-making details documented in ED Course.  Risk Prescription drug management. Parenteral controlled substances. Decision regarding hospitalization.   Iv ns. Continuous pulse ox and cardiac monitoring. Labs ordered/sent. Reviewed recent imaging.   Differential diagnosis includes dehydration, uti, osteoarthritis/flare up of chronic pain, etc. Dispo decision including potential need for admission considered - will get labs and reassess.   Reviewed nursing notes and prior charts for additional history. External reports reviewed. Additional history from:   Cardiac monitor: sinus rhythm, rate 88.  Labs reviewed/interpreted by me - wbc 11. Hgb 13. Chem largely unremarkable. Ua neg for uti.   Recent Xrays reviewed/interpreted by me - left pubic rami fx then.   Pt requests bil knee injected/steroid. Completed, no complications.   Recheck pt comfortable. No distress. Tolerating po.  Stable vitals. Currently denies gu c/o - cx sent.   Pt currently appears stable for ed d/c.   Rec close pcp f/u.  Return precautions provided.       Final diagnoses:  Poor fluid intake  Generalized weakness  Chronic  pain of both knees  Bilateral primary osteoarthritis of knee  Pubic ramus fracture, left, with routine healing,  subsequent encounter    ED Discharge Orders     None          Bernard Drivers, MD 01/18/24 1512

## 2024-01-19 ENCOUNTER — Encounter: Admitting: Orthopedic Surgery

## 2024-01-20 ENCOUNTER — Ambulatory Visit: Payer: Self-pay

## 2024-01-20 LAB — URINE CULTURE: Culture: 80000 — AB

## 2024-01-20 NOTE — Telephone Encounter (Signed)
 FYI Only or Action Required?: Action required by provider: request for antibiotic.  Patient was last seen in primary care on 01/13/2024 by Grooms, [place], [place].  Called Nurse Triage reporting Results.  Symptoms began a week ago.  Interventions attempted: Other: ED evaluation 01/18/24.  Symptoms are: unchanged.  Triage Disposition: Call PCP When Office is Open  Patient/caregiver understands and will follow disposition?: Yes  Copied from CRM #[id number]. Topic: Clinical - Red Word Triage >> Jan 20, 2024  3:38 PM Tobias CROME wrote: Red Word that prompted transfer to Nurse Triage: patient's labs from ED came back positive for E Coli, seeking medical advice on what to do Reason for Disposition  [1] Caller requesting NON-URGENT health information AND [2] PCP's office is the best resource  Answer Assessment - Initial Assessment Questions 1. REASON FOR CALL: What is the main reason for your call? or How can I best help you?     Patient calling regarding urine culture results on my chart, positive for E.Coli 2. SYMPTOMS : Do you have any symptoms?      Malodorous urine, generally not feeling well, patient recovering from pelvic fracture, so unable to know if any pain is related to possible UTI or pelvic pain 3. OTHER QUESTIONS: Do you have any other questions?     Is antibiotic needed? Would C. Grooms, PA-C be willing to call in antibiotic if indicated?  Please call patient's daughter, Doneta, at [phone] Patient uses [facility]  Protocols used: Information Only Call - No Triage-A-AH

## 2024-01-21 ENCOUNTER — Telehealth (HOSPITAL_BASED_OUTPATIENT_CLINIC_OR_DEPARTMENT_OTHER): Payer: Self-pay | Admitting: *Deleted

## 2024-01-21 NOTE — Telephone Encounter (Signed)
 Post ED Visit - Positive Culture Follow-up: Successful Patient Follow-Up  Culture assessed and recommendations reviewed by:  [x]  Vito Ralph, Pharm.D. []  Venetia Gully, Pharm.D., BCPS AQ-ID []  Garrel Crews, Pharm.D., BCPS []  Almarie Lunger, [address].D., BCPS []  [place], [place].D., BCPS, AAHIVP []  Rosaline Bihari, Pharm.D., BCPS, AAHIVP []  Vernell Meier, PharmD, BCPS []  Latanya Hint, PharmD, BCPS []  Donald Medley, PharmD, BCPS []  Rocky Bold, PharmD  Positive urine culture  [x]  Patient discharged without antimicrobial prescription and treatment is now indicated []  Organism is resistant to prescribed ED discharge antimicrobial []  Patient with positive blood cultures  Changes discussed with ED provider: Caron Salt New antibiotic prescription Keflex  500mg  BID x 7 days Called to [facility]  Contacted patient, date 01/21/24, time 1000   Jama Wyman Kipper 01/21/2024, 10:01 AM

## 2024-01-21 NOTE — Progress Notes (Signed)
 ED Antimicrobial Stewardship Positive Culture Follow Up   Tracy Townsend is an 83 y.o. female who presented to [facility] on 01/18/2024 with a chief complaint of  Chief Complaint  Patient presents with   Dehydration    Recent Results (from the past 720 hours)  Urine Culture     Status: Abnormal   Collection Time: 01/18/24 12:29 PM   Specimen: Urine, Clean Catch  Result Value Ref Range Status   Specimen Description   Final    URINE, CLEAN CATCH Performed at [facility], [address], [place], [place] [address]    Special Requests   Final    NONE Performed at [facility], [address] [place], [place] [address]    Culture 80,000 COLONIES/mL ESCHERICHIA COLI (A)  Final   Report Status 01/20/2024 FINAL  Final   Organism ID, Bacteria ESCHERICHIA COLI (A)  Final      Susceptibility   Escherichia coli - MIC*    AMPICILLIN 4 SENSITIVE Sensitive     CEFAZOLIN (URINE) Value in next row Sensitive      <=1 SENSITIVEThis is a modified FDA-approved test that has been validated and its performance characteristics determined by the reporting laboratory.  This laboratory is certified under the Clinical Laboratory Improvement Amendments CLIA as qualified to perform high complexity clinical laboratory testing.    CEFEPIME Value in next row Sensitive      <=1 SENSITIVEThis is a modified FDA-approved test that has been validated and its performance characteristics determined by the reporting laboratory.  This laboratory is certified under the Clinical Laboratory Improvement Amendments CLIA as qualified to perform high complexity clinical laboratory testing.    ERTAPENEM Value in next row Sensitive      <=1 SENSITIVEThis is a modified FDA-approved test that has been validated and its performance characteristics determined by the reporting laboratory.  This laboratory is certified under the Clinical Laboratory Improvement Amendments CLIA as qualified to perform high complexity clinical laboratory  testing.    CEFTRIAXONE  Value in next row Sensitive      <=1 SENSITIVEThis is a modified FDA-approved test that has been validated and its performance characteristics determined by the reporting laboratory.  This laboratory is certified under the Clinical Laboratory Improvement Amendments CLIA as qualified to perform high complexity clinical laboratory testing.    CIPROFLOXACIN  Value in next row Sensitive      <=1 SENSITIVEThis is a modified FDA-approved test that has been validated and its performance characteristics determined by the reporting laboratory.  This laboratory is certified under the Clinical Laboratory Improvement Amendments CLIA as qualified to perform high complexity clinical laboratory testing.    GENTAMICIN Value in next row Sensitive      <=1 SENSITIVEThis is a modified FDA-approved test that has been validated and its performance characteristics determined by the reporting laboratory.  This laboratory is certified under the Clinical Laboratory Improvement Amendments CLIA as qualified to perform high complexity clinical laboratory testing.    NITROFURANTOIN Value in next row Intermediate      <=1 SENSITIVEThis is a modified FDA-approved test that has been validated and its performance characteristics determined by the reporting laboratory.  This laboratory is certified under the Clinical Laboratory Improvement Amendments CLIA as qualified to perform high complexity clinical laboratory testing.    TRIMETH/SULFA Value in next row Sensitive      <=1 SENSITIVEThis is a modified FDA-approved test that has been validated and its performance characteristics determined by the reporting laboratory.  This laboratory is certified under  the Clinical Laboratory Improvement Amendments CLIA as qualified to perform high complexity clinical laboratory testing.    AMPICILLIN/SULBACTAM Value in next row Sensitive      <=1 SENSITIVEThis is a modified FDA-approved test that has been validated and its  performance characteristics determined by the reporting laboratory.  This laboratory is certified under the Clinical Laboratory Improvement Amendments CLIA as qualified to perform high complexity clinical laboratory testing.    PIP/TAZO Value in next row Sensitive      <=4 SENSITIVEThis is a modified FDA-approved test that has been validated and its performance characteristics determined by the reporting laboratory.  This laboratory is certified under the Clinical Laboratory Improvement Amendments CLIA as qualified to perform high complexity clinical laboratory testing.    MEROPENEM Value in next row Sensitive      <=4 SENSITIVEThis is a modified FDA-approved test that has been validated and its performance characteristics determined by the reporting laboratory.  This laboratory is certified under the Clinical Laboratory Improvement Amendments CLIA as qualified to perform high complexity clinical laboratory testing.    * 80,000 COLONIES/mL ESCHERICHIA COLI     [x]  Patient discharged originally without antimicrobial agent and treatment is now indicated. Noted that pt called Triage RN 10/29 as she saw positive culture results in [facility]. Referred to PCP office when they open.  RN to call patient and make sure she followed up with PCP. If not, prescribe Keflex  500mg  po BID x 7 days  ED Provider: Undrea Shipes Salt, DO  Vito Ralph, PharmD, BCPS 01/21/2024, 8:24 AM Clinical Pharmacist Monday - Friday phone -  [phone] Saturday - Sunday phone - [phone]

## 2024-01-22 ENCOUNTER — Other Ambulatory Visit: Payer: Self-pay | Admitting: Physician Assistant

## 2024-01-22 MED ORDER — CEPHALEXIN 500 MG PO CAPS: 500.0000 mg | ORAL_CAPSULE | Freq: Two times a day (BID) | ORAL | 0 refills | Status: AC

## 2024-02-15 ENCOUNTER — Encounter: Payer: Self-pay | Admitting: Physician Assistant

## 2024-02-15 ENCOUNTER — Ambulatory Visit (INDEPENDENT_AMBULATORY_CARE_PROVIDER_SITE_OTHER): Admitting: Physician Assistant

## 2024-02-15 VITALS — BP 144/88 | HR 69 | Temp 97.9°F | Wt 113.2 lb

## 2024-02-15 DIAGNOSIS — I10 Essential (primary) hypertension: Secondary | ICD-10-CM | POA: Diagnosis not present

## 2024-02-15 MED ORDER — HYDROCHLOROTHIAZIDE 12.5 MG PO TABS: 12.5000 mg | ORAL_TABLET | Freq: Every day | ORAL | 1 refills | Status: DC

## 2024-02-15 NOTE — Patient Instructions (Signed)
Lab work in 1 week

## 2024-02-15 NOTE — Assessment & Plan Note (Signed)
 Blood pressure remains elevated at 140/88 mmHg despite losartan. Blood pressure goal is 130/80 mmHg. - Added hydrochlorothiazide  12.5 mg once daily with losartan. - Ordered lab work in one week to check electrolytes and kidney function. - Scheduled follow-up in one month to reassess blood pressure, sooner if new symptoms such as chest pain, shortness of breath, headache, or visual changes.

## 2024-02-15 NOTE — Progress Notes (Signed)
 Established Patient Office Visit  Subjective   Patient ID: Tracy Townsend, female    DOB: [birth year]  Age: 83 y.o. MRN: [id number]  Chief Complaint  Patient presents with   Follow-up    Patient is here for a follow up visit for BP. BP was 144/92 today and she stated she took her medication this morning. Patient mentioned she was having extreme odor in the urine and having extreme bilateral knee pain, so daughter took her to the ER and gave her steroids in the knees and ran test and came back with E Coli. Was prescribed antibiotic and now wants to see if it has cleared up again.    Discussed the use of AI scribe software for clinical note transcription with the patient, who gave verbal consent to proceed.  History of Present Illness Tracy Townsend is an 83 year old female who presents for blood pressure  follow-up and wants to discuss a recent ER visit for a UTI.  She was treated in the emergency room for a urinary tract infection and completed a course of Keflex  with symptom resolution. She reports no dysuria and normal urine appearance. She has increased her water  intake and is avoiding soft drinks, with occasional orange juice consumption.  Currently on losartan for blood pressure control, and states daily compliance with medications. Patient denies headaches, blurry vision, dizziness, chest pain, shortness of breath, or palpitations. Following a low salt diet.    Review of Systems  Constitutional:  Negative for chills, fever and malaise/fatigue.  Eyes:  Negative for blurred vision and double vision.  Respiratory:  Negative for cough and shortness of breath.   Cardiovascular:  Negative for chest pain and palpitations.  Gastrointestinal:  Negative for abdominal pain.  Genitourinary:  Negative for dysuria, flank pain, frequency and urgency.  Neurological:  Negative for dizziness and headaches.      Objective:     BP (!) 144/88   Pulse 69   Temp 97.9 F (36.6 C)   Wt 113  lb 4 oz (51.4 kg)   BMI 21.40 kg/m    Physical Exam Constitutional:      General: She is not in acute distress.    Appearance: Normal appearance. She is normal weight. She is not ill-appearing.  HENT:     Head: Normocephalic and atraumatic.     Mouth/Throat:     Mouth: Mucous membranes are moist.     Pharynx: Oropharynx is clear.  Eyes:     Extraocular Movements: Extraocular movements intact.     Conjunctiva/sclera: Conjunctivae normal.  Cardiovascular:     Rate and Rhythm: Normal rate and regular rhythm.     Heart sounds: Normal heart sounds. No murmur heard. Pulmonary:     Effort: Pulmonary effort is normal.     Breath sounds: Normal breath sounds.  Musculoskeletal:     Right lower leg: No edema.     Left lower leg: No edema.  Skin:    General: Skin is warm and dry.  Neurological:     General: No focal deficit present.     Mental Status: She is alert and oriented to person, place, and time.  Psychiatric:        Mood and Affect: Mood normal.        Behavior: Behavior normal.     No results found for any visits on 02/15/24.  The ASCVD Risk score (Arnett DK, et al., 2019) failed to calculate for the following reasons:   The 2019 ASCVD risk  score is only valid for ages 60 to 47    Assessment & Plan:   Return in about 4 weeks (around 03/14/2024).   Primary hypertension Assessment & Plan: Blood pressure remains elevated at 140/88 mmHg despite losartan. Blood pressure goal is 130/80 mmHg. - Added hydrochlorothiazide  12.5 mg once daily with losartan. - Ordered lab work in one week to check electrolytes and kidney function. - Scheduled follow-up in one month to reassess blood pressure, sooner if new symptoms such as chest pain, shortness of breath, headache, or visual changes.   Orders: -     hydroCHLOROthiazide ; Take 1 tablet (12.5 mg total) by mouth daily.  Dispense: 30 tablet; Refill: 1 -     Basic metabolic panel with GFR   Tracy Skylor Hughson, PA-C

## 2024-02-25 ENCOUNTER — Ambulatory Visit: Payer: Self-pay | Admitting: Physician Assistant

## 2024-02-25 LAB — BASIC METABOLIC PANEL WITH GFR
BUN/Creatinine Ratio: 22 (ref 12–28)
BUN: 17 mg/dL (ref 8–27)
CO2: 25 mmol/L (ref 20–29)
Calcium: 8.9 mg/dL (ref 8.7–10.3)
Chloride: 99 mmol/L (ref 96–106)
Creatinine, Ser: 0.79 mg/dL (ref 0.57–1.00)
Glucose: 92 mg/dL (ref 70–99)
Potassium: 4.5 mmol/L (ref 3.5–5.2)
Sodium: 138 mmol/L (ref 134–144)
eGFR: 73 mL/min/1.73 (ref >59)

## 2024-03-14 ENCOUNTER — Ambulatory Visit: Admitting: Physician Assistant

## 2024-03-28 ENCOUNTER — Encounter: Payer: Self-pay | Admitting: Physician Assistant

## 2024-03-28 ENCOUNTER — Ambulatory Visit (INDEPENDENT_AMBULATORY_CARE_PROVIDER_SITE_OTHER): Admitting: Physician Assistant

## 2024-03-28 VITALS — BP 142/92 | HR 80 | Temp 97.5°F | Ht 61.0 in | Wt 116.4 lb

## 2024-03-28 DIAGNOSIS — I10 Essential (primary) hypertension: Secondary | ICD-10-CM

## 2024-03-28 MED ORDER — HYDROCHLOROTHIAZIDE 12.5 MG PO TABS: 12.5000 mg | ORAL_TABLET | Freq: Every day | ORAL | 1 refills | Status: AC

## 2024-03-28 NOTE — Assessment & Plan Note (Signed)
 142/92 Stable. Considering age and BP readings at home, no change in therapy. Goal to prevent hypotension, dizziness, and falls.  Continue current medications. No change in management. Discussed DASH diet and dietary sodium restrictions.  Continue dietary efforts and physical activity. Follow up in 3 months or sooner for reading elevated above 140/90 consistently or new shortness of breath, chest pain, headache, or visual changes.

## 2024-03-28 NOTE — Progress Notes (Signed)
 "  Established Patient Office Visit  Subjective   Patient ID: Tracy Townsend, female    DOB: [birth year]  Age: 84 y.o. MRN: [id number]  Chief Complaint  Patient presents with   Follow-up    Blood pressure med follow up  Pt states it was 117/67 this morning at home    Hypertension, follow-up  BP Readings from Last 3 Encounters:  03/28/24 (!) 142/92  02/15/24 (!) 144/88  01/18/24 126/67   Wt Readings from Last 3 Encounters:  03/28/24 116 lb 6.4 oz (52.8 kg)  02/15/24 113 lb 4 oz (51.4 kg)  01/18/24 119 lb 11.4 oz (54.3 kg)     She was last seen for hypertension 4 weeks ago.  BP at that visit was 144/88. Management since that visit includes addition of hydrochlorothiazide  12.5 mg.  She reports excellent compliance with treatment. She is not having side effects.  She is following a Regular diet. She is not exercising. She does not smoke.  Use of agents associated with hypertension: none.   Outside blood pressures are 117/67-128/85 at home. Symptoms: No chest pain No chest pressure  No palpitations No syncope  No dyspnea No orthopnea  No paroxysmal nocturnal dyspnea No lower extremity edema   Pertinent labs Lab Results  Component Value Date   CHOL 183 01/13/2024   HDL 56 01/13/2024   LDLCALC 111 (H) 01/13/2024   TRIG 85 01/13/2024   CHOLHDL 3.3 01/13/2024   Lab Results  Component Value Date   NA 138 02/24/2024   K 4.5 02/24/2024   CREATININE 0.79 02/24/2024   EGFR 73 02/24/2024   GLUCOSE 92 02/24/2024   TSH 1.26 11/27/2020     The ASCVD Risk score (Arnett DK, et al., 2019) failed to calculate for the following reasons:   The 2019 ASCVD risk score is only valid for ages 70 to 58   * - Cholesterol units were assumed    Review of Systems  Constitutional:  Negative for activity change, appetite change, fatigue and fever.  Eyes:  Negative for visual disturbance.  Respiratory:  Negative for chest tightness and shortness of breath.   Cardiovascular:  Negative  for chest pain and leg swelling.  Neurological:  Negative for dizziness, light-headedness and headaches.       Objective:     BP (!) 142/92 (BP Location: Right Arm, Cuff Size: Normal)   Pulse 80   Temp (!) 97.5 F (36.4 C)   Ht 5' 1 (1.549 m)   Wt 116 lb 6.4 oz (52.8 kg)   SpO2 97%   BMI 21.99 kg/m    Physical Exam Constitutional:      General: She is not in acute distress.    Appearance: Normal appearance. She is normal weight. She is not ill-appearing.  HENT:     Head: Normocephalic and atraumatic.     Mouth/Throat:     Mouth: Mucous membranes are moist.     Pharynx: Oropharynx is clear.  Eyes:     Extraocular Movements: Extraocular movements intact.     Conjunctiva/sclera: Conjunctivae normal.  Cardiovascular:     Rate and Rhythm: Normal rate and regular rhythm.     Heart sounds: Normal heart sounds. No murmur heard. Pulmonary:     Effort: Pulmonary effort is normal.     Breath sounds: Normal breath sounds. No wheezing or rales.  Musculoskeletal:     Right lower leg: No edema.     Left lower leg: No edema.  Skin:    General:  Skin is warm and dry.  Neurological:     General: No focal deficit present.     Mental Status: She is alert and oriented to person, place, and time.  Psychiatric:        Mood and Affect: Mood normal.        Behavior: Behavior normal.     No results found for any visits on 03/28/24.  The ASCVD Risk score (Arnett DK, et al., 2019) failed to calculate for the following reasons:   The 2019 ASCVD risk score is only valid for ages 27 to 54   * - Cholesterol units were assumed    Assessment & Plan:   Return in about 3 months (around 06/26/2024) for BP.   Primary hypertension Assessment & Plan: 142/92 Stable. Considering age and BP readings at home, no change in therapy. Goal to prevent hypotension, dizziness, and falls.  Continue current medications. No change in management. Discussed DASH diet and dietary sodium restrictions.   Continue dietary efforts and physical activity. Follow up in 3 months or sooner for reading elevated above 140/90 consistently or new shortness of breath, chest pain, headache, or visual changes.   Orders: -     hydroCHLOROthiazide ; Take 1 tablet (12.5 mg total) by mouth daily.  Dispense: 90 tablet; Refill: 1    Ford Peddie, PA-C "

## 2024-04-07 ENCOUNTER — Telehealth: Payer: Self-pay

## 2024-04-07 NOTE — Telephone Encounter (Signed)
 Copied from CRM [id number]. Topic: Clinical - Request for Lab/Test Order >> Apr 07, 2024  9:46 AM Delon DASEN wrote: Reason for CRM: Patient asking if she should get a mammogram- [phone]

## 2024-04-08 NOTE — Telephone Encounter (Signed)
 I informed the patient she is not wanting a mammogram right now, she just wanted to cover her bases

## 2024-07-13 ENCOUNTER — Ambulatory Visit: Admitting: Physician Assistant
